# Patient Record
Sex: Male | Born: 1980 | Race: White | Hispanic: No | Smoking: Former smoker
Health system: Southern US, Community
[De-identification: ages and names within clinical notes are randomized; demographics above are authoritative.]

## PROBLEM LIST (undated history)

## (undated) DIAGNOSIS — T8859XA Other complications of anesthesia, initial encounter: Secondary | ICD-10-CM

## (undated) DIAGNOSIS — N189 Chronic kidney disease, unspecified: Secondary | ICD-10-CM

## (undated) DIAGNOSIS — Z87442 Personal history of urinary calculi: Secondary | ICD-10-CM

## (undated) DIAGNOSIS — F419 Anxiety disorder, unspecified: Secondary | ICD-10-CM

## (undated) HISTORY — DX: Chronic kidney disease, unspecified: N18.9

## (undated) HISTORY — PX: MANDIBLE FRACTURE SURGERY: SHX706

---
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
## 2001-01-26 ENCOUNTER — Encounter: Payer: Self-pay | Admitting: Sports Medicine

## 2001-01-26 ENCOUNTER — Encounter: Admission: RE | Admit: 2001-01-26 | Discharge: 2001-01-26 | Payer: Self-pay | Admitting: Sports Medicine

## 2001-01-26 ENCOUNTER — Encounter: Admission: RE | Admit: 2001-01-26 | Discharge: 2001-01-26 | Payer: Self-pay | Admitting: Family Medicine

## 2001-02-03 ENCOUNTER — Encounter: Admission: RE | Admit: 2001-02-03 | Discharge: 2001-02-03 | Payer: Self-pay | Admitting: Sports Medicine

## 2001-02-13 ENCOUNTER — Encounter: Admission: RE | Admit: 2001-02-13 | Discharge: 2001-02-13 | Payer: Self-pay

## 2001-10-24 ENCOUNTER — Emergency Department (HOSPITAL_COMMUNITY): Admission: EM | Admit: 2001-10-24 | Discharge: 2001-10-24 | Payer: Self-pay | Admitting: Emergency Medicine

## 2002-06-28 ENCOUNTER — Emergency Department (HOSPITAL_COMMUNITY): Admission: EM | Admit: 2002-06-28 | Discharge: 2002-06-28 | Payer: Self-pay | Admitting: Emergency Medicine

## 2002-06-28 ENCOUNTER — Encounter: Payer: Self-pay | Admitting: Emergency Medicine

## 2002-07-14 ENCOUNTER — Emergency Department (HOSPITAL_COMMUNITY): Admission: EM | Admit: 2002-07-14 | Discharge: 2002-07-14 | Payer: Self-pay | Admitting: Emergency Medicine

## 2002-11-18 ENCOUNTER — Emergency Department (HOSPITAL_COMMUNITY): Admission: EM | Admit: 2002-11-18 | Discharge: 2002-11-19 | Payer: Self-pay | Admitting: Emergency Medicine

## 2002-11-18 ENCOUNTER — Encounter: Payer: Self-pay | Admitting: Emergency Medicine

## 2003-01-19 ENCOUNTER — Emergency Department (HOSPITAL_COMMUNITY): Admission: EM | Admit: 2003-01-19 | Discharge: 2003-01-20 | Payer: Self-pay | Admitting: Emergency Medicine

## 2003-01-20 ENCOUNTER — Encounter: Payer: Self-pay | Admitting: Emergency Medicine

## 2013-10-03 ENCOUNTER — Emergency Department (HOSPITAL_COMMUNITY)
Admission: EM | Admit: 2013-10-03 | Discharge: 2013-10-03 | Disposition: A | Discharge status: Home / Self Care | Payer: BC Managed Care – PPO | Source: Home / Self Care | Attending: Family Medicine | Admitting: Family Medicine

## 2013-10-03 ENCOUNTER — Encounter (HOSPITAL_COMMUNITY): Payer: Self-pay | Admitting: Emergency Medicine

## 2013-10-03 DIAGNOSIS — K649 Unspecified hemorrhoids: Secondary | ICD-10-CM

## 2013-10-03 NOTE — ED Provider Notes (Signed)
CSN: [id number]     Arrival date & time 10/03/13  1928 History   First MD Initiated Contact with Patient 10/03/13 2050     Chief Complaint  Patient presents with  . Mass   (Consider location/radiation/quality/duration/timing/severity/associated sxs/prior Treatment) HPI  Hemorrhoid - painful, 3 days duration, no bleeding, patient with history of constipation for several years, he is trying stool softener without improvement   Past Medical History  Diagnosis Date  . Kidney stones    History reviewed. No pertinent past surgical history. Family History  Problem Relation Age of Onset  . Hypertension Father   . Asthma Father    History  Substance Use Topics  . Smoking status: Former Smoker    Types: Cigarettes    Quit date: 06/07/2013  . Smokeless tobacco: Current User    Types: Chew  . Alcohol Use: 16.8 oz/week    28 Cans of beer per week    Review of Systems See HPI Allergies  Codeine  Home Medications   Prior to Admission medications   Not on File   BP 137/83  Pulse 80  Temp(Src) 97.9 F (36.6 C) (Oral)  Resp 18  SpO2 98% Physical Exam Gen: middle age WM, non ill appearing Rectum: anterior hemorrhoid, non thrombosed, normal rectal tone ED Course  Procedures (including critical care time) Labs Review Labs Reviewed - No data to display  Imaging Review No results found.   MDM   1. Hemorrhoid    Non thrombosed, Conservative treatment only.     Garnetta BuddyEdward V Ermias Tomeo, MD 10/03/13 2135

## 2013-10-03 NOTE — Discharge Instructions (Signed)
Mr. Jonathan Turner,   You have a hemorrhoid. Please doe the following things:   1. Warm bath for relief several times per day 2. Preparation H or other steroid cream 3. Miralax or dulcolax of other supplement for increase the softness of the stool 4. [facility]  Take Care,   Dr. Clinton SawyerWilliamson

## 2013-10-03 NOTE — ED Notes (Signed)
C/o bump on anus onset 2 days ago.  Getting bigger and harder and more painful.  Could not sleep today or yesterday because of pain. No drainage.  No BM since Mon.  Has been constipated for awhile.  Using suppositories.

## 2013-10-03 NOTE — ED Provider Notes (Signed)
Medical screening examination/treatment/procedure(s) were performed by a resident physician or non-physician practitioner and as the supervising physician I was immediately available for consultation/collaboration.  Evan Corey, MD    Evan S Corey, MD 10/03/13 2142 

## 2013-10-04 ENCOUNTER — Encounter (INDEPENDENT_AMBULATORY_CARE_PROVIDER_SITE_OTHER): Payer: Self-pay | Admitting: Surgery

## 2013-10-04 ENCOUNTER — Ambulatory Visit (INDEPENDENT_AMBULATORY_CARE_PROVIDER_SITE_OTHER): Payer: BC Managed Care – PPO | Admitting: Surgery

## 2013-10-04 ENCOUNTER — Encounter (INDEPENDENT_AMBULATORY_CARE_PROVIDER_SITE_OTHER): Payer: Self-pay

## 2013-10-04 VITALS — BP 122/78 | HR 80 | Temp 98.5°F | Ht 67.0 in | Wt 182.4 lb

## 2013-10-04 DIAGNOSIS — K645 Perianal venous thrombosis: Secondary | ICD-10-CM

## 2013-10-04 MED ORDER — TRAMADOL HCL 50 MG PO TABS: 50.0000 mg | ORAL_TABLET | Freq: Two times a day (BID) | ORAL | Status: DC | PRN

## 2013-10-04 NOTE — Progress Notes (Signed)
Subjective:     Patient ID: Jonathan PearJoshua H Aday, male   DOB: 03/04/1981, 33 y.o.   MRN: [id number]  HPI The patient presents with chief complaint of pain around the anus. This is present since Tuesday. He has significant itching in the anal canal and swelling. It is tender to touch. Patient has problems with constipation he states.  Review of Systems  Respiratory: Negative.   Cardiovascular: Negative.   Gastrointestinal: Positive for constipation and rectal pain.  Musculoskeletal: Negative.   Skin: Negative.    Past Medical History  Diagnosis Date  . Kidney stones   History reviewed. No pertinent past surgical history. No current outpatient prescriptions on file prior to visit.   No current facility-administered medications on file prior to visit.   Allergies  Allergen Reactions  . Codeine Other (See Comments)     Goes in to nuclear meltdown- gets irritable   Family History  Problem Relation Age of Onset  . Hypertension Father   . Asthma Father       Objective:   Physical Exam  Constitutional: He appears well-developed and well-nourished.  Pulmonary/Chest: Effort normal.  Abdominal: Soft.  Genitourinary:     Skin: Skin is warm and dry.  Psychiatric: He has a normal mood and affect. His behavior is normal. Thought content normal.       Assessment:     Thrombosed external hemorrhoid    Plan:     Discussed options of observation versus surgical excision. The pros and cons of each were discussed. Complications of bleeding infection recurrence. He was to proceed with excision. Patient placed in left lateral position. Anal canal was prepped with Betadine 1% lidocaine with epinephrine was used for local anesthesia. This was injected into the thrombosed hemorrhoid. This was left lateral position. Hemorrhoid complex excised. Dry dressing applied.Pt tolerated well.  Post procedure instructions given. Return full activity in 3 days and wear a pad. High fiber diet. miralax daily.    Return as needed.

## 2013-10-04 NOTE — Patient Instructions (Signed)
Warm tub soaks three times a day for 20 min for the next 3 days.  Return to work  Monday. Wear pad as needed. miralax daily as directed.  Fiber supplement daily metamucil,  citrucel etc.                 Hemorrhoidectomy Care After Hemorrhoidectomy is the removal of enlarged (dilated) veins around the rectum. Until the surgical areas are healed, control of pain and avoiding constipation are the greatest challenges for patients.  For as long as 24 hours after receiving an anesthetic (the medication that made you sleep), and while taking narcotic pain relievers, you may feel dizzy, weak and drowsy. For that reason, the following information applies to the first 24-hour period following surgery, and continues for as long as you are taking narcotic pain medications.  Do not drive a car, ride a bicycle, participate in activities in which you could be hurt. Do not take public transportation until you are off narcotic pain medications and until your caregiver says it is okay.  Do not drink alcohol, take tranquilizers, or medications not prescribed or allowed by your surgical caregiver.  Do not sign important papers or contracts for at least 24 hours or while taking narcotic medications.  Have a responsible person with you for 24 hours. RISKS AND COMPLICATIONS Some problems that may occur following this procedure include:  Infection. A germ starts growing in the tissue surrounding the site operated on. This can usually be treated with antibiotics.  Damage to the rectal sphincter could occur. This is the muscle that opens in your anus to allow a bowel movement. This could cause incontinence. This is uncommon.  Bleeding following surgery can be a complication of almost any surgery. Your surgeon takes every precaution to keep this from happening.  Complications of anesthesia. HOME CARE INSTRUCTIONS  Avoid straining when having bowel movements.  Avoid heavy lifting (more than 10 pounds  (4.5 kilograms)).  Only take over-the-counter or prescription medicines for pain, discomfort, or fever as directed by your caregiver.  Take hot sitz baths for 20 to 30 minutes, 3 to 4 times per day.  To keep swelling down, apply an ice pack for twenty minutes three to four times per day between sitz baths. Use a towel between your skin and the ice pack. Do not do this if it causes too much discomfort.  Keep anal area clean and dry. Following a bowel movement, you can gently wash the area with tucks (available for purchase at a drugstore) or cotton swabs. Gently pat the area dry. Do not rub the area.  Eat a well balanced diet and drink 6 to 8 glasses of water every day to avoid constipation. A bulk laxative may be also be helpful. SEEK MEDICAL CARE IF:   You have increasing pain or tenderness near or in the surgical site.  You are unable to eat or drink.  You develop nausea or vomiting.  You develop uncontrolled bleeding such as soaking two to three pads in one hour.  You have constipation, not helped by changing your diet or increasing your fluid intake. Pain medications are a common cause of constipation.  You have pain and redness (inflammation) extending outside the area of your surgery.  You develop an unexplained oral temperature above 102 F (38.9 C), or any other signs of infection.  You have any other questions or concerns following surgery. Document Released: 08/14/2003 Document Revised: 08/16/2011 Document Reviewed: 11/11/2008 [facility] Patient Information 2014 [employer] [place].

## 2013-11-14 ENCOUNTER — Emergency Department (HOSPITAL_COMMUNITY)
Admission: EM | Admit: 2013-11-14 | Discharge: 2013-11-14 | Disposition: A | Discharge status: Home / Self Care | Payer: BC Managed Care – PPO | Attending: Emergency Medicine | Admitting: Emergency Medicine

## 2013-11-14 ENCOUNTER — Emergency Department (HOSPITAL_COMMUNITY): Payer: BC Managed Care – PPO

## 2013-11-14 ENCOUNTER — Encounter (HOSPITAL_COMMUNITY): Payer: Self-pay | Admitting: Emergency Medicine

## 2013-11-14 DIAGNOSIS — R42 Dizziness and giddiness: Secondary | ICD-10-CM | POA: Insufficient documentation

## 2013-11-14 DIAGNOSIS — M62838 Other muscle spasm: Secondary | ICD-10-CM

## 2013-11-14 DIAGNOSIS — F419 Anxiety disorder, unspecified: Secondary | ICD-10-CM

## 2013-11-14 DIAGNOSIS — R0601 Orthopnea: Secondary | ICD-10-CM | POA: Insufficient documentation

## 2013-11-14 DIAGNOSIS — R0602 Shortness of breath: Secondary | ICD-10-CM | POA: Insufficient documentation

## 2013-11-14 DIAGNOSIS — F411 Generalized anxiety disorder: Secondary | ICD-10-CM | POA: Insufficient documentation

## 2013-11-14 DIAGNOSIS — R61 Generalized hyperhidrosis: Secondary | ICD-10-CM | POA: Insufficient documentation

## 2013-11-14 DIAGNOSIS — Z87442 Personal history of urinary calculi: Secondary | ICD-10-CM | POA: Insufficient documentation

## 2013-11-14 DIAGNOSIS — Z87891 Personal history of nicotine dependence: Secondary | ICD-10-CM | POA: Insufficient documentation

## 2013-11-14 DIAGNOSIS — R110 Nausea: Secondary | ICD-10-CM | POA: Insufficient documentation

## 2013-11-14 HISTORY — DX: Anxiety disorder, unspecified: F41.9

## 2013-11-14 LAB — CBC
HCT: 43 % (ref 39.0–52.0)
HEMOGLOBIN: 14.5 g/dL (ref 13.0–17.0)
MCH: 29.2 pg (ref 26.0–34.0)
MCHC: 33.7 g/dL (ref 30.0–36.0)
MCV: 86.5 fL (ref 78.0–100.0)
Platelets: 301 10*3/uL (ref 150–400)
RBC: 4.97 MIL/uL (ref 4.22–5.81)
RDW: 12.8 % (ref 11.5–15.5)
WBC: 6.6 10*3/uL (ref 4.0–10.5)

## 2013-11-14 LAB — BASIC METABOLIC PANEL
BUN: 12 mg/dL (ref 6–23)
CALCIUM: 9.4 mg/dL (ref 8.4–10.5)
CO2: 27 mEq/L (ref 19–32)
Chloride: 101 mEq/L (ref 96–112)
Creatinine, Ser: 0.86 mg/dL (ref 0.50–1.35)
GFR calc Af Amer: >90 mL/min (ref >90)
Glucose, Bld: 85 mg/dL (ref 70–99)
Potassium: 4.1 mEq/L (ref 3.7–5.3)
SODIUM: 140 meq/L (ref 137–147)

## 2013-11-14 LAB — PRO B NATRIURETIC PEPTIDE: Pro B Natriuretic peptide (BNP): <5 pg/mL (ref 0–125)

## 2013-11-14 LAB — I-STAT TROPONIN, ED: TROPONIN I, POC: 0 ng/mL (ref 0.00–0.08)

## 2013-11-14 MED ORDER — DIAZEPAM 5 MG PO TABS
5.0000 mg | ORAL_TABLET | Freq: Once | ORAL | Status: AC
  Administered 2013-11-14: 5 mg via ORAL
  Filled 2013-11-14: qty 1

## 2013-11-14 MED ORDER — KETOROLAC TROMETHAMINE 30 MG/ML IJ SOLN
30.0000 mg | Freq: Once | INTRAMUSCULAR | Status: AC
  Administered 2013-11-14: 30 mg via INTRAMUSCULAR
  Filled 2013-11-14: qty 1

## 2013-11-14 MED ORDER — DIAZEPAM 2 MG PO TABS: 2.0000 mg | ORAL_TABLET | Freq: Three times a day (TID) | ORAL | Status: DC | PRN

## 2013-11-14 MED ORDER — IBUPROFEN 600 MG PO TABS: 600.0000 mg | ORAL_TABLET | Freq: Four times a day (QID) | ORAL | Status: DC | PRN

## 2013-11-14 MED ORDER — KETOROLAC TROMETHAMINE 30 MG/ML IJ SOLN: 30.0000 mg | Freq: Once | INTRAMUSCULAR | Status: DC

## 2013-11-14 NOTE — ED Provider Notes (Addendum)
CSN: [id number]     Arrival date & time 11/14/13  1821 History   First MD Initiated Contact with Patient 11/14/13 2033     Chief Complaint  Patient presents with  . Chest Pain    The patient has been having chest pain today after doing sit ups.  The pain is central chest and is radiating to the right arm with numbness and tingling     (Consider location/radiation/quality/duration/timing/severity/associated sxs/prior Treatment) HPI Comments: This is a 32y/o male presenting today with 5/10 band-like CP and R arm pain that began today while he was doing sit ups. Reports he felt SOB, dizzy, lightheaded, nauseas, and diaphoretic so he stopped doing sit ups. Felt better after laying down for a minute, but then the CP returned so he came. He also reports his R arm is numb/tingling and hurts from his neck all the way down. States he started a job recently that requires heavy lifting, and reports that he's felt more stressed due to his job. Both pains are worse with movement. Denies any illicit drug use or consumption of any energy drinks. Reports drinking daily, and last drink was at 3AM (prior to onset of symptoms). Has anxiety but denies using any medications to treat this. Smoked for 9 yrs, but no longer smokes. Works as a [employer] architecttruck driver. Recently started a new exercise routine.  Patient is a 33 y.o. male presenting with chest pain. The history is provided by the patient and the spouse.  Chest Pain Pain location:  L chest Pain quality: pressure   Pain radiates to:  R jaw, R shoulder and R arm Pain radiates to the back: no   Pain severity:  Moderate (5/10) Onset quality:  Sudden Duration: since 5:30 pm. Timing:  Constant Progression:  Unchanged Chronicity:  New Context: movement (while doing sit ups)   Relieved by:  None tried Worsened by:  Movement Ineffective treatments:  None tried Associated symptoms: anxiety, cough (with white sputum production), diaphoresis, dizziness, nausea,  near-syncope, numbness (right arm), palpitations and shortness of breath   Associated symptoms: no abdominal pain, no back pain, no headache and not vomiting   Cough:    Cough characteristics:  Productive   Sputum characteristics:  White   Severity:  Mild   Onset quality:  Gradual   Timing:  Intermittent   Progression:  Unchanged   Chronicity:  Chronic Nausea:    Severity:  Mild   Onset quality:  Sudden   Progression:  Resolved Shortness of breath:    Severity:  Mild   Onset quality:  Sudden   Timing:  Constant   Progression:  Resolved Risk factors: male sex and smoking (former smoker)     Past Medical History  Diagnosis Date  . Kidney stones   . Anxiety    History reviewed. No pertinent past surgical history. Family History  Problem Relation Age of Onset  . Hypertension Father   . Asthma Father    History  Substance Use Topics  . Smoking status: Former Smoker    Types: Cigarettes    Quit date: 06/07/2013  . Smokeless tobacco: Current User    Types: Chew  . Alcohol Use: 16.8 oz/week    28 Cans of beer per week    Review of Systems  Constitutional: Positive for diaphoresis.  HENT: Negative for congestion, sinus pressure and sore throat.   Eyes: Negative for pain and visual disturbance.  Respiratory: Positive for cough (with white sputum production), chest tightness and shortness of breath.  Negative for wheezing.   Cardiovascular: Positive for chest pain, palpitations and near-syncope.  Gastrointestinal: Positive for nausea. Negative for vomiting, abdominal pain, diarrhea and constipation.  Endocrine: Negative for cold intolerance and heat intolerance.  Genitourinary: Negative for dysuria and difficulty urinating.  Musculoskeletal: Positive for neck pain (right sided, feels tight). Negative for arthralgias and back pain.  Skin: Negative for rash.  Neurological: Positive for dizziness, light-headedness and numbness (right arm). Negative for headaches.   Psychiatric/Behavioral: The patient is nervous/anxious (feels stressed due to new job and recent move).       Allergies  Codeine  Home Medications   Prior to Admission medications   Medication Sig Start Date End Date Taking? Authorizing Provider  diazepam (VALIUM) 2 MG tablet Take 1 tablet (2 mg total) by mouth every 8 (eight) hours as needed for anxiety or muscle spasms. 11/14/13   Chance Munter Strupp Camprubi-Soms, PA-C  ibuprofen (ADVIL,MOTRIN) 600 MG tablet Take 1 tablet (600 mg total) by mouth every 6 (six) hours as needed. 11/14/13   Sunshine Mackowski Strupp Camprubi-Soms, PA-C   BP 106/65  Pulse 52  Temp(Src) 97.8 F (36.6 C) (Oral)  Resp 16  SpO2 96% Physical Exam  Nursing note and vitals reviewed. Constitutional: He is oriented to person, place, and time. He appears well-developed and well-nourished. No distress.  HENT:  Head: Normocephalic and atraumatic.  Eyes: Pupils are equal, round, and reactive to light.  Neck: Normal range of motion. Neck supple.  Reports pain with lateral rotation to the right. Right trapezius tense with spasm  Cardiovascular: Normal rate, regular rhythm, normal heart sounds and intact distal pulses.   Pulmonary/Chest: Effort normal and breath sounds normal. He has no wheezes. He exhibits no tenderness.  Abdominal: Soft. Bowel sounds are normal. He exhibits no distension. There is no tenderness. There is no rebound and no guarding.  Musculoskeletal: Normal range of motion.       Right shoulder: He exhibits tenderness (overlying trapezius, which is notibly tense).  Lymphadenopathy:    He has no cervical adenopathy.  Neurological: He is alert and oriented to person, place, and time. He has normal strength. No sensory deficit.  Skin: Skin is warm and dry.  Psychiatric: His mood appears anxious.    ED Course  Procedures (including critical care time) Labs Review Labs Reviewed  CBC  BASIC METABOLIC PANEL  PRO B NATRIURETIC PEPTIDE  I-STAT TROPOININ, ED     Imaging Review Dg Chest 2 View  11/14/2013   CLINICAL DATA:  Chest pain.  EXAM: CHEST  2 VIEW  COMPARISON:  None.  FINDINGS: The heart size and mediastinal contours are within normal limits. Both lungs are clear. The visualized skeletal structures are unremarkable.  IMPRESSION: No active cardiopulmonary disease.   Electronically Signed   By: Davonna Belling M.D.   On: 11/14/2013 19:41     EKG Interpretation None      MDM   Final diagnoses:  Muscle spasm of right shoulder  Anxiety    Negative cardiac work up including CXR and cardiac enzymes. CBC and CMP within normal limits. I believe this is mostly anxiety related to his recent move and increased stress at work, as well as paracervical muscle spasms related to his recent increased exercise regimen. Pt given Toradol and Valium and will reassess.  At reassessment, pt denies continued CP, states that his R arm still hurts but is feeling better and has less numbness. After long discussion with pt and spouse, discussed his alcohol consumption and reluctantly will  discharge home with Ibuprofen 600mg  q6h and Valium 2mg  q8h PRN anxiety and spasms x5 pills, instructed pt to refrain from alcohol consumption with this medication. Also advised pt not to drive while using this medication. Use heat over affected area as well. Will recommend f/up with PCP, which he will need to be referred to since he is new to the area.     Donnita [place], PA-C 11/14/13 [address] [place], [place] 11/15/13 [phone]

## 2013-11-14 NOTE — Discharge Instructions (Signed)
DO NOT DRIVE WHILE TAKING VALIUM. Muscle Cramps and Spasms Muscle cramps and spasms occur when a muscle or muscles tighten and you have no control over this tightening (involuntary muscle contraction). They are a common problem and can develop in any muscle. The most common place is in the calf muscles of the leg. Both muscle cramps and muscle spasms are involuntary muscle contractions, but they also have differences:   Muscle cramps are sporadic and painful. They may last a few seconds to a quarter of an hour. Muscle cramps are often more forceful and last longer than muscle spasms.  Muscle spasms may or may not be painful. They may also last just a few seconds or much longer. CAUSES  It is uncommon for cramps or spasms to be due to a serious underlying problem. In many cases, the cause of cramps or spasms is unknown. Some common causes are:   Overexertion.   Overuse from repetitive motions (doing the same thing over and over).   Remaining in a certain position for a long period of time.   Improper preparation, form, or technique while performing a sport or activity.   Dehydration.   Injury.   Side effects of some medicines.   Abnormally low levels of the salts and ions in your blood (electrolytes), especially potassium and calcium. This could happen if you are taking water pills (diuretics) or you are pregnant.  Some underlying medical problems can make it more likely to develop cramps or spasms. These include, but are not limited to:   Diabetes.   Parkinson disease.   Hormone disorders, such as thyroid problems.   Alcohol abuse.   Diseases specific to muscles, joints, and bones.   Blood vessel disease where not enough blood is getting to the muscles.  HOME CARE INSTRUCTIONS   Stay well hydrated. Drink enough water and fluids to keep your urine clear or pale yellow.  It may be helpful to massage, stretch, and relax the affected muscle.  For tight or tense  muscles, use a warm towel, heating pad, or hot shower water directed to the affected area.  If you are sore or have pain after a cramp or spasm, applying ice to the affected area may relieve discomfort.  Put ice in a plastic bag.  Place a towel between your skin and the bag.  Leave the ice on for 15-20 minutes, 03-04 times a day.  Medicines used to treat a known cause of cramps or spasms may help reduce their frequency or severity. Only take over-the-counter or prescription medicines as directed by your caregiver. SEEK MEDICAL CARE IF:  Your cramps or spasms get more severe, more frequent, or do not improve over time.  MAKE SURE YOU:   Understand these instructions.  Will watch your condition.  Will get help right away if you are not doing well or get worse. Document Released: 11/13/2001 Document Revised: 09/18/2012 Document Reviewed: 05/10/2012 [facility] Patient Information 2014 [place], [place].  Heat Therapy Heat therapy can help ease achy, tense, stiff, and tight muscles and joints. Heat should not be used on new injuries. Wait at least 48 hours after the injury before using heat therapy. Heat also should not be used for discomfort or pain that occurs right after doing an activity. If you still have pain or stiffness 3 hours after finishing the activity, then heat therapy may be used. PRECAUTIONS  High heat or prolonged exposure to heat can cause burns. Be careful when using heat therapy to avoid burning  your skin. If you have any of the following conditions, do not use heat until you have discussed heat therapy with your caregiver:  Poor circulation.  Healing wounds or scarred skin in the area being treated.  Diabetes, heart disease, or high blood pressure.  Numbness of the area being treated.  Unusual swelling of the area being treated.  Active infections.  Blood clots.  Cancer.  Inability to communicate your response to pain. This can include young children and people  with dementia. HOME CARE INSTRUCTIONS Moist heat pack  Soak a clean towel in warm water, and squeeze out the extra water. The water temperature should be comfortable to the skin.  Put the warm, wet towel in a plastic bag.  Place a thin, dry towel between your skin and the bag.  Put the heat pack on the area for 5 minutes, and check your skin. Your skin may be pink, but it should not be red.  Leave the heat pack on the area for a total of 15 to 30 minutes.  Repeat this every 2 to 4 hours while awake. Do not use heat while you are sleeping. Warm water bath  Fill a tub with warm water. The water temperature should be comfortable to the skin.  Place the affected body part in the tub.  Soak the area for 20 to 40 minutes.  Repeat as needed. Hot water bottle  Fill the water bottle half full with hot water.  Press out the extra air. Close the cap tightly.  Place a dry towel between your skin and the bottle.  Put the bottle on the area for 5 minutes, and check your skin. Your skin may be pink, but it should not be red.  Leave the bottle on the area for a total of 15 to 30 minutes.  Repeat this every 2 to 4 hours while awake. Electric heating pad  Place a dry towel between your skin and the heating pad.  Set the heating pad on low heat.  Put the heating pad on the area for 10 minutes, and check your skin. Your skin may be pink, but it should not be red.  Leave the heating pad on the area for a total of 20 to 40 minutes.  Repeat this every 2 to 4 hours while awake.  Do not lie on the heating pad.  Do not fall asleep while using the heating pad.  Do not use the heating pad near water. Contact with water can result in an electrical shock. SEEK MEDICAL CARE IF:  You have blisters, redness, swelling, or numbness.  You have any new problems.  Your problems are getting worse.  You have any questions or concerns. If you develop any problems, stop using heat therapy until  you see your caregiver. MAKE SURE YOU:  Understand these instructions.  Will watch your condition.  Will get help right away if you are not doing well or get worse. Document Released: 08/16/2011 Document Reviewed: 08/16/2011 [facility] Patient Information 2014 [place], [place].  Emergency Department Resource Guide 1) Find a Doctor and Pay Out of Pocket Although you won't have to find out who is covered by your insurance plan, it is a good idea to ask around and get recommendations. You will then need to call the office and see if the doctor you have chosen will accept you as a new patient and what types of options they offer for patients who are self-pay. Some doctors offer discounts or will set up  payment plans for their patients who do not have insurance, but you will need to ask so you aren't surprised when you get to your appointment.  2) Contact Your Local Health Department Not all health departments have doctors that can see patients for sick visits, but many do, so it is worth a call to see if yours does. If you don't know where your local health department is, you can check in your phone book. The CDC also has a tool to help you locate your state's health department, and many state websites also have listings of all of their local health departments.  3) Find a Walk-in Clinic If your illness is not likely to be very severe or complicated, you may want to try a walk in clinic. These are popping up all over the country in pharmacies, drugstores, and shopping centers. They're usually staffed by nurse practitioners or physician assistants that have been trained to treat common illnesses and complaints. They're usually fairly quick and inexpensive. However, if you have serious medical issues or chronic medical problems, these are probably not your best option.  No Primary Care Doctor: - Call Health Connect at  [phone] - they can help you locate a primary care doctor that  accepts your  insurance, provides certain services, etc. - Physician Referral Service- [phone]  Chronic Pain Problems: Organization         Address  Phone   Notes  [facility]  [phone] Patients need to be referred by their primary care doctor.   Medication Assistance: Organization         Address  Phone   Notes  [facility] [address] [place], [place] [id number] [phone] --Must be a resident of [facility] [facility] [place] -- Must have NO insurance coverage whatsoever (no Medicaid/ Medicare, etc.) -- The pt. MUST have a primary care doctor that directs their care regularly and follows them in the community   [facility]ist  [phone]   [employer]  [phone]) [phone]    Agencies that provide inexpensive medical care: Organization         Address  Phone   Notes  [facility]  [phone]   [facility]    [phone]   [facility] [address] [place], [place] [address] [phone]   [facility] [address], [place] [phone]) [phone]   Planned Parenthood    [phone]   [facility]    [phone]) [phone]   [facility]  [address], [place] Phone:  [phone], Fax:  [phone] Hours of Operation:  9 am - 6 pm, M-F.  Also accepts Medicaid/Medicare and self-pay.  [facility]  [address], [place] Phone: [phone], Fax: [phone]. Hours of Operation:  8:30 am - 5:30 pm, M-F.  Also accepts Medicaid and self-pay.  [facility] [place] [address], [place] Phone: [phone]) [phone]   [facility] [address], [place], [place] [phone], Ext. [phone] Mondays & Thursdays: 7-9 AM.  First 15 patients are seen on a first come, first serve basis.    Medicaid-accepting [facility] Providers:  Organization          Address  Phone   Notes  [facility] [address], [place] [phone] Also accepts  self-pay patients.  [facility] [address] Laurell Josephs [place], [place]  [phone]   [facility] [address], [place] [phone]   [facility] [address], [place] [phone]   Renaye Rakers [address], [place]   [phone] Only accepts [place] Access [place] patients after they have their name applied to their card.   Self-Pay (no insurance) in [facility]:  Organization         Address  Phone   Notes  Sickle Cell Patients, [facility] [address] [place], [place] [phone]   [facility] [address] [place], [place] [phone]   [facility] [place]  [address],  [phone]   [facility]/Dr. Osei-Bonsu  [address], [place] or [address], [place] [phone] Phone number for both [place] and [place] locations is the same.  [facility] [address], [place] [phone]   [facility] [address] [place] or [address] [phone]   [facility] [address] [place] [phone], phone; [phone], fax Sees patients 1st and 3rd Saturday of every month.  Must not qualify for public or private insurance (i.e. Medicaid, Medicare, [place] Health Choice, Veterans' Benefits)  Household income should be no more than 200% of the poverty level The clinic cannot treat you if you are pregnant or think you are pregnant  Sexually transmitted diseases are not treated at the clinic.    Dental Care: Organization         Address  Phone  Notes  [facility] [address] [place],  [place] [phone] Accepts children up to age 81 who are enrolled in [place] or [place] Health Choice; pregnant women with a Medicaid card; and children who have applied for Medicaid or [place] Health Choice, but were declined, whose parents can pay a reduced fee at time of service.  [facility] Department of [facility]  [address], [place] [phone] Accepts children up to age 33 who are enrolled in [place] or [place] Health Choice; pregnant women with a Medicaid card; and children who have applied for Medicaid or [place] Health Choice, but were declined, whose parents can pay a reduced fee at time of service.  [facility]  [address] [place], [place] [phone] Patients are seen by appointment only. Walk-ins are not accepted. [facility] will see patients 37 years of age and older. Monday - Tuesday (8am-5pm) Most Wednesdays (8:30-5pm) $30 per visit, cash only  [facility] Adult Dental Access PROGRAM  [address], [facility] [phone] Patients are seen by appointment only. Walk-ins are not accepted. [facility] will see patients 16 years of age and older. One Wednesday Evening (Monthly: Volunteer Based).  $30 per visit, cash only  [employer]  [phone] for adults; Children under age 33, call Graduate Pediatric Dentistry at [phone]. Children aged 9-14, please call [phone] to request a pediatric application.  Dental services are provided in all areas of dental care including fillings, crowns and bridges, complete and partial dentures, implants, gum treatment, root canals, and extractions. Preventive care is also provided. Treatment is provided to  both adults and children. Patients are selected via a lottery and there is often a waiting list.   [facility] [address] [place]  [phone] [email or web address]   [facility] [address] [place], [place]  [phone], Ext. [phone] Second and Fourth Thursday of each month, opens at 6:30 AM; Clinic ends at 9 AM.  Patients are seen on a first-come first-served basis, and a limited number are seen during each clinic.   [facility]  [address] Ether Griffins [place], [place] [phone]   Eligibility Requirements You must have lived in [place], [place], or  counties for at least the last three months.   You cannot be eligible for state or federal sponsored [place], including [employer], [place], or [employer].   You generally cannot be eligible for healthcare insurance through your employer.    How to apply: Eligibility screenings are held every Tuesday and Wednesday afternoon from 1:00 pm until 4:00 pm. You do not need an appointment for the interview!  [facility] [address] [place], [place] [phone]   [facility] Health Department  [phone]   [facility] Health Department  [phone]   [facility] Health Department  [phone]    Behavioral Health Resources in the Community: Intensive Outpatient Programs Organization         Address  Phone  Notes  [facility] [address] [place], [place] [phone]   [facility] Outpatient [address] [place], [place] [phone]   [facility]: [facility] [address] [place], [place]  [phone]   [facility] Mental Health [address],  [place], [place] [phone] or [phone]   Substance Abuse Resources Organization         Address  Phone  Notes  Al[facility] Drug Services  [phone]   [facility]  [phone]   [facility] [place]  [phone]   Floydene Flock  [phone]   Residential & Outpatient Substance Abuse Program  [phone]   Psychological Services Organization         Address  Phone  Notes  [facility] Behavioral Health  [phone]   [facility] Services  [phone]    [facility] Mental Health [address] [place] [phone] or [phone]    Mobile Crisis Teams Organization         Address  Phone  Notes  [facility], Mobile Crisis Care Unit  [phone]   [facility]  [address] [place], [place] [phone]   Doristine Locks [address] [place] [phone]    Self-Help/Support Groups Organization         Address  Phone             Notes  [facility] [place] - variety of support groups  336- I7437963 Call for more information  Narcotics Anonymous (NA), [facility]aring [facility] [address], [employer] [place]  2 meetings at this location   Statistician         Address  Phone  Notes  [facility] [address],    [place]  [phone]   [facility]  [address] [place] [address], [place], [place] [phone]   [facility] Treatment [facility] [address] [place], [place] [phone] Admissions: 8am-3pm M-F  [facility] [address],    [place], [place] [phone]  [facility] [address]ling Manns [place], [place] [phone]   [facility] [address]  [place], [place] [phone]   [facility] - Intensive Outpatient [address]ure[address], [place], [place] [phone]   [facility] ([facility]iction [facility] [address] [place]  [place], [place] [phone] or [phone]   [facility] (RTS) [address] [place], [place] [phone] Accepts Medicaid  [facility] [address],  [place] [phone] Substance Abuse/Addiction Treatment   [facility] Organization         Address  Phone  Notes  [facility]  [phone]   Angie Fava, PhD [address] Ervin Knack [place], [place]   [phone] or [phone]   [facility] Behavioral   [address] [place] [address] [place], [place] [phone]   [facility] [address], [place], [place] [phone] Insurance/Medicaid/sponsorship through [facility] and Families [address]                                    [place], [place] [phone] Therapy/tele-psych/case  [facility] [address], [place] [phone]    Dr. Lolly Mustache  [phone]   [facility] [place] [facility]  [employer] [facility] Dept. 1) [address] [place] 2) [address], [place] 3)  [address] [place] [address], [place] [phone]  [phone]   [facility] Child Abuse Hotline 719-156-0030 or [phone] (After Hours)

## 2013-11-14 NOTE — ED Notes (Signed)
The patient has been having chest pain today after doing sit ups.  The pain is central chest and is radiating to the right arm with numbness and tingling.  The patient has SOB, diaphoresis, nausea, dizziness, lightheadedness.  The patient says he has been stressing out today and doesn't know why he is having the chest pain and numbness.  He did say he has anxiety but doesn't think that is what it is.  The patient rates his 7/10.

## 2013-11-14 NOTE — ED Provider Notes (Signed)
  Medical screening examination/treatment/procedure(s) were performed by non-physician practitioner and as supervising physician I was available for consultation/collaboration.  EKG shows sinus rhythm, rate 60, left anterior fascicular block, right bundle branch block, abnormal    Gerhard Munch, MD 11/14/13 2358

## 2013-11-16 NOTE — ED Provider Notes (Signed)
Medical screening examination/treatment/procedure(s) were performed by non-physician practitioner and as supervising physician I was immediately available for consultation/collaboration.   EKG Interpretation   Date/Time:  Wednesday November 14 2013 18:28:16 EDT Ventricular Rate:  66 PR Interval:  150 QRS Duration: 116 QT Interval:  380 QTC Calculation: 398 R Axis:   -51 Text Interpretation:  Normal sinus rhythm Incomplete right bundle branch  block Left anterior fascicular block Cannot rule out Anterior infarct ,  age undetermined Abnormal ECG Sinus rhythm Left anterior fasicular block  Right bundle branch block Abnormal ekg Confirmed by Gerhard MunchLOCKWOOD, Haley Fuerstenberg  MD  ([id number]) on 11/14/2013 11:58:10 PM        Gerhard Munchobert Mackenzey [place], MD 11/16/13 2359

## 2015-06-24 ENCOUNTER — Encounter (HOSPITAL_COMMUNITY): Payer: Self-pay | Admitting: Emergency Medicine

## 2015-06-24 ENCOUNTER — Emergency Department (HOSPITAL_COMMUNITY)
Admission: EM | Admit: 2015-06-24 | Discharge: 2015-06-24 | Disposition: A | Discharge status: Home / Self Care | Payer: BLUE CROSS/BLUE SHIELD | Attending: Emergency Medicine | Admitting: Emergency Medicine

## 2015-06-24 ENCOUNTER — Emergency Department (HOSPITAL_COMMUNITY): Payer: BLUE CROSS/BLUE SHIELD

## 2015-06-24 DIAGNOSIS — G8929 Other chronic pain: Secondary | ICD-10-CM | POA: Diagnosis not present

## 2015-06-24 DIAGNOSIS — Z87442 Personal history of urinary calculi: Secondary | ICD-10-CM | POA: Diagnosis not present

## 2015-06-24 DIAGNOSIS — Z87891 Personal history of nicotine dependence: Secondary | ICD-10-CM | POA: Diagnosis not present

## 2015-06-24 DIAGNOSIS — M542 Cervicalgia: Secondary | ICD-10-CM

## 2015-06-24 DIAGNOSIS — R079 Chest pain, unspecified: Secondary | ICD-10-CM | POA: Diagnosis present

## 2015-06-24 DIAGNOSIS — F419 Anxiety disorder, unspecified: Secondary | ICD-10-CM | POA: Diagnosis not present

## 2015-06-24 LAB — CBC
HCT: 44.8 % (ref 39.0–52.0)
Hemoglobin: 15.3 g/dL (ref 13.0–17.0)
MCH: 29.5 pg (ref 26.0–34.0)
MCHC: 34.2 g/dL (ref 30.0–36.0)
MCV: 86.3 fL (ref 78.0–100.0)
PLATELETS: 297 10*3/uL (ref 150–400)
RBC: 5.19 MIL/uL (ref 4.22–5.81)
RDW: 12.9 % (ref 11.5–15.5)
WBC: 7.5 10*3/uL (ref 4.0–10.5)

## 2015-06-24 LAB — BASIC METABOLIC PANEL
Anion gap: 10 (ref 5–15)
BUN: 10 mg/dL (ref 6–20)
CALCIUM: 9.6 mg/dL (ref 8.9–10.3)
CO2: 25 mmol/L (ref 22–32)
CREATININE: 0.9 mg/dL (ref 0.61–1.24)
Chloride: 105 mmol/L (ref 101–111)
GFR calc non Af Amer: >60 mL/min (ref >60)
GLUCOSE: 99 mg/dL (ref 65–99)
Potassium: 4 mmol/L (ref 3.5–5.1)
Sodium: 140 mmol/L (ref 135–145)

## 2015-06-24 LAB — I-STAT TROPONIN, ED: TROPONIN I, POC: 0 ng/mL (ref 0.00–0.08)

## 2015-06-24 MED ORDER — PREDNISONE 20 MG PO TABS
60.0000 mg | ORAL_TABLET | Freq: Once | ORAL | Status: AC
  Administered 2015-06-24: 60 mg via ORAL
  Filled 2015-06-24: qty 3

## 2015-06-24 MED ORDER — PREDNISONE 20 MG PO TABS: 40.0000 mg | ORAL_TABLET | Freq: Every day | ORAL | Status: DC

## 2015-06-24 MED ORDER — CYCLOBENZAPRINE HCL 10 MG PO TABS: 10.0000 mg | ORAL_TABLET | Freq: Two times a day (BID) | ORAL | Status: DC | PRN

## 2015-06-24 MED ORDER — NAPROXEN 500 MG PO TABS
500.0000 mg | ORAL_TABLET | Freq: Two times a day (BID) | ORAL | Status: DC
Start: 2015-06-24 — End: 2016-02-26

## 2015-06-24 MED ORDER — KETOROLAC TROMETHAMINE 60 MG/2ML IM SOLN
60.0000 mg | Freq: Once | INTRAMUSCULAR | Status: AC
  Administered 2015-06-24: 60 mg via INTRAMUSCULAR
  Filled 2015-06-24: qty 2

## 2015-06-24 NOTE — ED Provider Notes (Signed)
CSN: [id number]     Arrival date & time 06/24/15  1408 History   First MD Initiated Contact with Patient 06/24/15 1923     Chief Complaint  Patient presents with  . Chest Pain     (Consider location/radiation/quality/duration/timing/severity/associated sxs/prior Treatment) HPI Jonathan Turner is a 35 y.o. male with hx of kidney stones and anxiety, presents to ED with complaint of right neck pain. Pt states he has had neck pain for 6 months. States he has been seen by orthopedic specialist and even did a course of physical therapy. He states that it did not help, and his pain got worse yesterday. He states today he had an episode of severe sudden onset of sharp pain in the same area which he states "makes me dizzy, short of breath, pale, and I felt numb all over." Patient states that since being in the waiting room, he has felt better. He still continues to have the same chronic pain in his neck. When he checked into the ER he told triage nurse that he was feeling short of breath, and he had some blood work and chest x-ray ordered for shortness of breath workup. At this time patient denies numbness or weakness in his extremities. He denies any shortness of breath. He denies any recent injuries. 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 He denies any recent injuries.  Past Medical History  Diagnosis Date  . Kidney stones   . Anxiety    History reviewed. No pertinent past surgical history. Family History  Problem Relation Age of Onset  . Hypertension Father   . Asthma Father    Social History  Substance Use Topics  . Smoking status: Former Smoker    Types: Cigarettes    Quit date: 06/07/2013  . Smokeless tobacco: Current User    Types: Chew  . Alcohol Use: No    Review of Systems  Constitutional: Negative for fever and chills.  Respiratory: Negative for cough, chest tightness and shortness of breath.   Cardiovascular: Negative for chest pain,  palpitations and leg swelling.  Gastrointestinal: Negative for nausea, vomiting, abdominal pain, diarrhea and abdominal distention.  Genitourinary: Negative for urgency.  Musculoskeletal: Positive for neck pain and neck stiffness. Negative for myalgias, back pain and arthralgias.  Skin: Negative for rash.  Allergic/Immunologic: Negative for immunocompromised state.  Neurological: Negative for dizziness, weakness, light-headedness, numbness and headaches.  All other systems reviewed and are negative.     Allergies  Codeine  Home Medications   Prior to Admission medications   Medication Sig Start Date End Date Taking? Authorizing Provider  diazepam (VALIUM) 2 MG tablet Take 1 tablet (2 mg total) by mouth every 8 (eight) hours as needed for anxiety or muscle spasms. 11/14/13   Mercedes Camprubi-Soms, PA-C  ibuprofen (ADVIL,MOTRIN) 600 MG tablet Take 1 tablet (600 mg total) by mouth every 6 (six) hours as needed. 11/14/13   Mercedes Camprubi-Soms, PA-C   BP 132/85 mmHg  Pulse 69  Temp(Src) 98 F (36.7 C) (Oral)  Resp 18  SpO2 100% Physical Exam  Constitutional: He is oriented to person, place, and time. He appears well-developed and well-nourished. No distress.  HENT:  Head: Normocephalic and atraumatic.  Eyes: Conjunctivae are normal.  Neck: Normal range of motion. Neck supple.  Full range of motion of the neck. Tenderness to palpation over right trapezius muscle and sternocleidomastoid. Pain with range of motion of the neck.  Cardiovascular: Normal rate, regular rhythm and normal  heart sounds.   Pulmonary/Chest: Effort normal. No respiratory distress. He has no wheezes. He has no rales.  Abdominal: Soft. Bowel sounds are normal. He exhibits no distension. There is no tenderness. There is no rebound.  Musculoskeletal: He exhibits no edema.  Neurological: He is alert and oriented to person, place, and time.  Reflex Scores:      Brachioradialis reflexes are 2+ on the right side and  2+ on the left side. 5/5 and equal upper and lower extremity strength bilaterally. Equal grip strength bilaterally. Normal sensation in bilateral upper arms and forearms. Normal sensation bilateral hands. Refill less than 2 seconds distally. Distal radial pulses intact and equal bilaterally.  Skin: Skin is warm and dry.  Nursing note and vitals reviewed.   ED Course  Procedures (including critical care time) Labs Review Labs Reviewed  BASIC METABOLIC PANEL  CBC  I-STAT TROPOININ, ED    Imaging Review Dg Chest 2 View  06/24/2015  CLINICAL DATA:  Midsternal chest pain with shortness of breath for 4 days EXAM: CHEST  2 VIEW COMPARISON:  November 14, 2013 FINDINGS: Lungs are clear. Heart size and pulmonary vascularity are normal. No adenopathy. There is slight upper thoracic levoscoliosis. No pneumothorax. IMPRESSION: No edema or consolidation. Electronically Signed   By: Bretta Bang III M.D.   On: 06/24/2015 18:10   I have personally reviewed and evaluated these images and lab results as part of my medical decision-making.   EKG Interpretation None      MDM   Final diagnoses:  Neck pain    patient with right-sided neck pain for 6 months, worsened yesterday and today. His symptoms are now almost completely resolved other than chronic pain in the neck. He states episode started suddenly today, associated with shortness of breath, dizziness, sensation of numbness all over and fingers tingling. Question muscle spasm with vagal response versus anxiety. He is neurovascularly intact. His strength and reflexes intact bilateral arms. I do not think he needs an emergent MRI, although patient is wondering if he has some nerve damage which is responsible for his hand tingling. To him he will need to follow-up with his orthopedics doctor, he requested another referral. Will give referral to orthopedist on call. He stated that he will just wanted second opinion. Will prescribe penicillin, Flexeril,  advised to follow-up as needed.  Filed Vitals:   06/24/15 1417 06/24/15 1627  BP: 142/87 132/85  Pulse: 77 69  Temp: 97.7 F (36.5 C) 98 F (36.7 C)  TempSrc: Oral Oral  Resp: 18 18  SpO2: 100% 100%     Jaynie Crumble, PA-C 06/24/15 2017  Leta Baptist, MD 07/02/15 2150

## 2015-06-24 NOTE — Discharge Instructions (Signed)
Naproxen as prescribed for inflammation. Prednisone for inflammation as well. Take until all gone. Take Flexeril at night time or as needed. Try heating pads, stretches, follow-up with orthopedic specialist.   Muscle Cramps and Spasms Muscle cramps and spasms occur when a muscle or muscles tighten and you have no control over this tightening (involuntary muscle contraction). They are a common problem and can develop in any muscle. The most common place is in the calf muscles of the leg. Both muscle cramps and muscle spasms are involuntary muscle contractions, but they also have differences:   Muscle cramps are sporadic and painful. They may last a few seconds to a quarter of an hour. Muscle cramps are often more forceful and last longer than muscle spasms.  Muscle spasms may or may not be painful. They may also last just a few seconds or much longer. CAUSES  It is uncommon for cramps or spasms to be due to a serious underlying problem. In many cases, the cause of cramps or spasms is unknown. Some common causes are:   Overexertion.   Overuse from repetitive motions (doing the same thing over and over).   Remaining in a certain position for a long period of time.   Improper preparation, form, or technique while performing a sport or activity.   Dehydration.   Injury.   Side effects of some medicines.   Abnormally low levels of the salts and ions in your blood (electrolytes), especially potassium and calcium. This could happen if you are taking water pills (diuretics) or you are pregnant.  Some underlying medical problems can make it more likely to develop cramps or spasms. These include, but are not limited to:   Diabetes.   Parkinson disease.   Hormone disorders, such as thyroid problems.   Alcohol abuse.   Diseases specific to muscles, joints, and bones.   Blood vessel disease where not enough blood is getting to the muscles.  HOME CARE INSTRUCTIONS   Stay well  hydrated. Drink enough water and fluids to keep your urine clear or pale yellow.  It may be helpful to massage, stretch, and relax the affected muscle.  For tight or tense muscles, use a warm towel, heating pad, or hot shower water directed to the affected area.  If you are sore or have pain after a cramp or spasm, applying ice to the affected area may relieve discomfort.  Put ice in a plastic bag.  Place a towel between your skin and the bag.  Leave the ice on for 15-20 minutes, 03-04 times a day.  Medicines used to treat a known cause of cramps or spasms may help reduce their frequency or severity. Only take over-the-counter or prescription medicines as directed by your caregiver. SEEK MEDICAL CARE IF:  Your cramps or spasms get more severe, more frequent, or do not improve over time.  MAKE SURE YOU:   Understand these instructions.  Will watch your condition.  Will get help right away if you are not doing well or get worse.   This information is not intended to replace advice given to you by your health care provider. Make sure you discuss any questions you have with your health care provider.   Document Released: 11/13/2001 Document Revised: 09/18/2012 Document Reviewed: 05/10/2012 Elsevier Interactive Patient Education 2016 [employer].   Acute Torticollis Torticollis is a condition in which the muscles of the neck tighten (contract) abnormally, causing the neck to twist and the head to move into an unnatural position. Torticollis  that develops suddenly is called acute torticollis. If torticollis becomes chronic and is left untreated, the face and neck can become deformed. CAUSES This condition may be caused by:  Sleeping in an awkward position (common).  Extending or twisting the neck muscles beyond their normal position.  Infection. In some cases, the cause may not be known. SYMPTOMS Symptoms of this condition include:  An unnatural position of the head.  Neck  pain.  A limited ability to move the neck.  Twisting of the neck to one side. DIAGNOSIS This condition is diagnosed with a physical exam. You may also have imaging tests, such as an X-ray, CT scan, or MRI. TREATMENT Treatment for this condition involves trying to relax the neck muscles. It may include:  Medicines or shots.  Physical therapy.  Surgery. This may be done in severe cases. HOME CARE INSTRUCTIONS  Take medicines only as directed by your health care provider.  Do stretching exercises and massage your neck as directed by your health care provider.  Keep all follow-up visits as directed by your health care provider. This is important. SEEK MEDICAL CARE IF:  You develop a fever. SEEK IMMEDIATE MEDICAL CARE IF:  You develop difficulty breathing.  You develop noisy breathing (stridor).  You start drooling.  You have trouble swallowing or have pain with swallowing.  You develop numbness or weakness in your hands or feet.  You have changes in your speech, understanding, or vision.  Your pain gets worse.   This information is not intended to replace advice given to you by your health care provider. Make sure you discuss any questions you have with your health care provider.   Document Released: 05/21/2000 Document Revised: 10/08/2014 Document Reviewed: 05/20/2014 Elsevier Interactive Patient Education [employer]

## 2015-06-24 NOTE — ED Notes (Signed)
Pt sts mid sternal CP that is tightness with numbness all over; pt sts hx of anxiety and having sx since Friday

## 2015-06-24 NOTE — ED Notes (Signed)
Pt left with all his belongings and ambulated out of the treatment area.  

## 2015-07-03 ENCOUNTER — Telehealth (HOSPITAL_BASED_OUTPATIENT_CLINIC_OR_DEPARTMENT_OTHER): Payer: Self-pay | Admitting: Emergency Medicine

## 2016-02-13 ENCOUNTER — Encounter: Payer: Self-pay | Admitting: Vascular Surgery

## 2016-02-19 ENCOUNTER — Encounter: Payer: Self-pay | Admitting: Vascular Surgery

## 2016-02-19 ENCOUNTER — Ambulatory Visit (INDEPENDENT_AMBULATORY_CARE_PROVIDER_SITE_OTHER): Payer: Worker's Compensation | Admitting: Vascular Surgery

## 2016-02-19 VITALS — BP 125/81 | HR 62 | Temp 97.0°F | Resp 16 | Ht 67.0 in | Wt 185.0 lb

## 2016-02-19 DIAGNOSIS — M542 Cervicalgia: Secondary | ICD-10-CM

## 2016-02-19 NOTE — Progress Notes (Signed)
Referring Physician: Tressie StalkerJeffrey Jenkins, MD  Patient name: Jonathan PearJoshua H Turner MRN: [id number] DOB: 12/25/1980 Sex: male  REASON FOR CONSULT: Possible thoracic outlet syndrome HPI: Jonathan PearJoshua H Turner is a 35 y.o. male,  with about a 16 month history of right neck pain with numbness and tingling in the tips of digits 3 and 4 right side. The patient denies any specific traumatic event to the right arm. However he states that his symptoms initially presented after doing a significant amount of heavy lifting over his head at work. He states that the symptoms have not really improved any over the last year. He did get some improvement after a course of physical therapy when he was evaluated by Dr. Shelle IronBeane with orthopedics. However, he stated that his physical therapy appointments ran out and he has not had any further evaluation or treatment since then. I discussed with him whether or not they gave him any certain exercises to do regarding his physical therapy evaluation and he stated that he has not currently done any exercise is related to this. He denies any swelling in the arm. He apparently has been to the emergency room several times complaining of feeling like his heart stopped followed by a racing of his heart. He was diagnosed with anxiety. He is currently not taking any medications. The pain is fairly constant and dull but when he has his "episodes" the neck pain gets worse. He denies any swelling in the upper extremity. His sister and several [employer]men[employer]. representatives were present for the office visit today. He denies any other medical problems.  Past Medical History:  Diagnosis Date  . Anxiety   . Kidney stones    No past surgical history on file.  Family History  Problem Relation Age of Onset  . Hypertension Father   . Asthma Father     SOCIAL HISTORY: Social History   Social History  . Marital status: Married    Spouse name: N/A  . Number of children: N/A  . Years of education: N/A     Occupational History  . Not on file.   Social History Main Topics  . Smoking status: Former Smoker    Types: Cigarettes    Quit date: 06/07/2013  . Smokeless tobacco: Current User    Types: Chew  . Alcohol use No  . Drug use: No  . Sexual activity: Not on file   Other Topics Concern  . Not on file   Social History Narrative  . No narrative on file    Allergies  Allergen Reactions  . Codeine Other (See Comments)     Goes in to nuclear meltdown- gets irritable    Current Outpatient Prescriptions  Medication Sig Dispense Refill  . ibuprofen (ADVIL,MOTRIN) 600 MG tablet Take 1 tablet (600 mg total) by mouth every 6 (six) hours as needed. 30 tablet 0  . cyclobenzaprine (FLEXERIL) 10 MG tablet Take 1 tablet (10 mg total) by mouth 2 (two) times daily as needed for muscle spasms. (Patient not taking: Reported on 02/19/2016) 20 tablet 0  . diazepam (VALIUM) 2 MG tablet Take 1 tablet (2 mg total) by mouth every 8 (eight) hours as needed for anxiety or muscle spasms. (Patient not taking: Reported on 02/19/2016) 5 tablet 0  . gabapentin (NEURONTIN) 100 MG capsule Take 100 mg by mouth 3 (three) times daily.    . naproxen (NAPROSYN) 500 MG tablet Take 1 tablet (500 mg total) by mouth 2 (two) times daily. (Patient not taking:  Reported on 02/19/2016) 30 tablet 0  . predniSONE (DELTASONE) 20 MG tablet Take 2 tablets (40 mg total) by mouth daily. (Patient not taking: Reported on 02/19/2016) 10 tablet 0   No current facility-administered medications for this visit.     ROS:   General:  No weight loss, Fever, chills  HEENT: No recent headaches, no nasal bleeding, no visual changes, no sore throat  Neurologic: No dizziness, blackouts, seizures. No recent symptoms of stroke or mini- stroke. No recent episodes of slurred speech, or temporary blindness.  Cardiac: No recent episodes of chest pain/pressure, no shortness of breath at rest.  No shortness of breath with exertion.  Denies history of  atrial fibrillation or irregular heartbeat  Vascular: No history of rest pain in feet.  No history of claudication.  No history of non-healing ulcer, No history of DVT   Pulmonary: No home oxygen, no productive cough, no hemoptysis,  No asthma or wheezing  Musculoskeletal:  [ ]  Arthritis, [ ]  Low back pain,  [ ]  Joint pain  Hematologic:No history of hypercoagulable state.  No history of easy bleeding.  No history of anemia  Gastrointestinal: No hematochezia or melena,  No gastroesophageal reflux, no trouble swallowing  Urinary: [ ]  chronic Kidney disease, [ ]  on HD - [ ]  MWF or [ ]  TTHS, [ ]  Burning with urination, [ ]  Frequent urination, [ ]  Difficulty urinating;   Skin: No rashes  Psychological: No history of anxiety,  No history of depression   Physical Examination  Vitals:   02/19/16 1400  BP: 125/81  Pulse: 62  Resp: 16  Temp: 97 F (36.1 C)  TempSrc: Oral  SpO2: 99%  Weight: 185 lb (83.9 kg)  Height: 5\' 7"  (1.702 m)    Body mass index is 28.98 kg/m.  General:  Alert and oriented, no acute distress HEENT: Normal Neck: No bruit or JVD, tender to palpation base of right neck especially in the supraclavicular region right side Pulmonary: Clear to auscultation bilaterally Cardiac: Regular Rate and Rhythm without murmur Abdomen: Soft, non-tender, non-distended, no mass, no scars Skin: No rash, symmetric right and left upper extremities no significant edema Extremity Pulses:  2+ radial, brachial pulses bilaterally Musculoskeletal: No deformity or edema, patient has some element of pain in the right base of the right neck and supraclavicular region when flexing the right sternocleidomastoid muscle. He has mild pain but not similar when doing this on the contralateral side.  Neurologic: Upper and lower extremity motor 5/5 and symmetric  DATA:  The patient recently had an MRI scan performed of his neck and shoulder region. I reviewed all of these images with the patient  and his family today in the room. 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 On my review of the images I did not see any significant narrowing of any vascular structures specifically in the right thoracic outlet region. I also reviewed a previous chest x-ray that had been on outpatient which showed no cervical ribs.  Reviewed over 20 pages of medical records from Dr. Lovell Sheehan and Dr. Shelle Iron today. This suggested he did have some mild cervical disc disease but it was not thought to be the source of all of his symptoms. It was mentioned that an EMG had been done by Dr. Vela Prose previously but those details were not available and records not available today for review. Patient stated that he thought as far as the EMG was normal.  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 I did not really see significant compression on his MRI scan. I discussed with the patient today and additional course of physical therapy since apparently he did seem to get some benefit from this. This would be a course of physical therapy specifically related to thoracic outlet syndrome. He seemed interested in this. However, his sister also was adamant that the patient receive a ""diagnosis".  I did reassure the patient that I did not think he had any life threatening problems related to the symptoms in his right hand and neck. I agree with his previous emergency room assessment from January suggesting that this may be a vagal response as far as his heart rate symptoms are concerned.   PLAN:  I offered the patient a follow-up visit after his physical therapy to see if his symptoms have improved. However his sister was adamant that he receive a diagnosis today. I discussed with them that he could potentially have thoracic outlet syndrome but this is a difficult  diagnosis to make when only neurogenic symptoms are present. I have referred him to Dr. Algis Downs' amico at [facility] for an additional opinion regarding possible neurogenic thoracic outlet syndrome. They will follow up with me on an as-needed basis.   Fabienne Bruns, MD [facility] Office: [phone] Pager: [phone]

## 2016-02-26 ENCOUNTER — Emergency Department (HOSPITAL_COMMUNITY)
Admission: EM | Admit: 2016-02-26 | Discharge: 2016-02-26 | Disposition: A | Discharge status: Home / Self Care | Payer: Worker's Compensation | Attending: Emergency Medicine | Admitting: Emergency Medicine

## 2016-02-26 ENCOUNTER — Emergency Department (HOSPITAL_COMMUNITY): Payer: Worker's Compensation

## 2016-02-26 ENCOUNTER — Encounter (HOSPITAL_COMMUNITY): Payer: Self-pay | Admitting: Emergency Medicine

## 2016-02-26 DIAGNOSIS — X509XXA Other and unspecified overexertion or strenuous movements or postures, initial encounter: Secondary | ICD-10-CM | POA: Insufficient documentation

## 2016-02-26 DIAGNOSIS — Z87891 Personal history of nicotine dependence: Secondary | ICD-10-CM | POA: Diagnosis not present

## 2016-02-26 DIAGNOSIS — Y9389 Activity, other specified: Secondary | ICD-10-CM | POA: Diagnosis not present

## 2016-02-26 DIAGNOSIS — R42 Dizziness and giddiness: Secondary | ICD-10-CM

## 2016-02-26 DIAGNOSIS — M5410 Radiculopathy, site unspecified: Secondary | ICD-10-CM | POA: Insufficient documentation

## 2016-02-26 DIAGNOSIS — Y990 Civilian activity done for income or pay: Secondary | ICD-10-CM | POA: Diagnosis not present

## 2016-02-26 DIAGNOSIS — Y9289 Other specified places as the place of occurrence of the external cause: Secondary | ICD-10-CM | POA: Insufficient documentation

## 2016-02-26 DIAGNOSIS — M792 Neuralgia and neuritis, unspecified: Secondary | ICD-10-CM

## 2016-02-26 DIAGNOSIS — M542 Cervicalgia: Secondary | ICD-10-CM | POA: Diagnosis present

## 2016-02-26 MED ORDER — KETOROLAC TROMETHAMINE 30 MG/ML IJ SOLN
30.0000 mg | Freq: Once | INTRAMUSCULAR | Status: AC
  Administered 2016-02-26: 30 mg via INTRAVENOUS
  Filled 2016-02-26: qty 1

## 2016-02-26 MED ORDER — HYDROMORPHONE HCL 1 MG/ML IJ SOLN
0.5000 mg | Freq: Once | INTRAMUSCULAR | Status: AC
  Administered 2016-02-26: 0.5 mg via INTRAVENOUS
  Filled 2016-02-26: qty 1

## 2016-02-26 MED ORDER — SODIUM CHLORIDE 0.9 % IV BOLUS (SEPSIS)
1000.0000 mL | Freq: Once | INTRAVENOUS | Status: AC
  Administered 2016-02-26: 1000 mL via INTRAVENOUS

## 2016-02-26 MED ORDER — MECLIZINE HCL 25 MG PO TABS
50.0000 mg | ORAL_TABLET | Freq: Once | ORAL | Status: AC
  Administered 2016-02-26: 50 mg via ORAL
  Filled 2016-02-26: qty 2

## 2016-02-26 MED ORDER — MECLIZINE HCL 25 MG PO TABS: 25.0000 mg | ORAL_TABLET | Freq: Three times a day (TID) | ORAL | 0 refills | Status: DC | PRN

## 2016-02-26 NOTE — ED Notes (Signed)
Pt provided with a boxed lunch and water at this time per pt request.  Pt continues to await MRI at this time.

## 2016-02-26 NOTE — ED Provider Notes (Signed)
[facility]-EMERGENCY DEPT Provider Note   CSN: [id number] Arrival date & time: 02/26/16  1452     History   Chief Complaint Chief Complaint  Patient presents with  . Neck Injury    HPI Jonathan Turner is a 35 y.o. male who presents with neck pain. He states he has been having symptoms constantly since he heard something "pop" when lifting at his job in April of 2017. He reports pain is worse with movement of his neck and right shoulder and arm. The pain radiates down his right arm and feels like a sharp stabbing pain. He has associated dysarthria, eye pain with movement, photophobia, numbness, and the feeling that his heart stops. He states today he has had lightheadedness and dizziness with positional changes. He was outside earlier today for about 30 minutes however states he has had adequate fluid intake. He states he believes he is dying. Denies LOC, fever, HA, CP, SOB, abdominal pain, N/V, recent injury. He saw Dr. Darrick Penna with Vascular surgery on 9/14 who states he may possibly have neurogenic thoracic outlet syndrome and was subsequently referred to Dr. Algis DownsGeorg Ruddle at [facility] for further evaluation. He has also seen Dr. Shelle Iron with Ortho who had him complete physical therapy which provided some relief. Unfortunately this was cut short which he is upset about. 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 Never had MRI of brain. He had an EMG done by Dr. Vela Prose with Neuro which was normal. He is scheduled for another round of physical therapy as well.  HPI  Past Medical History:  Diagnosis Date  . Anxiety   . Kidney stones     Patient Active Problem List   Diagnosis Date Noted  . Thrombosed external hemorrhoid 10/04/2013    History reviewed. No pertinent surgical history.    Home Medications    Prior to Admission medications   Medication Sig Start Date End Date Taking? Authorizing Provider  cyclobenzaprine  (FLEXERIL) 10 MG tablet Take 1 tablet (10 mg total) by mouth 2 (two) times daily as needed for muscle spasms. Patient not taking: Reported on 02/19/2016 06/24/15   Tatyana Kirichenko, PA-C  diazepam (VALIUM) 2 MG tablet Take 1 tablet (2 mg total) by mouth every 8 (eight) hours as needed for anxiety or muscle spasms. Patient not taking: Reported on 02/19/2016 11/14/13   Mercedes Camprubi-Soms, PA-C  gabapentin (NEURONTIN) 100 MG capsule Take 100 mg by mouth 3 (three) times daily.    Historical Provider, MD  ibuprofen (ADVIL,MOTRIN) 600 MG tablet Take 1 tablet (600 mg total) by mouth every 6 (six) hours as needed. 11/14/13   Mercedes Camprubi-Soms, PA-C  naproxen (NAPROSYN) 500 MG tablet Take 1 tablet (500 mg total) by mouth 2 (two) times daily. Patient not taking: Reported on 02/19/2016 06/24/15   Tatyana Kirichenko, PA-C  predniSONE (DELTASONE) 20 MG tablet Take 2 tablets (40 mg total) by mouth daily. Patient not taking: Reported on 02/19/2016 06/24/15   Jaynie Crumble, PA-C    Family History Family History  Problem Relation Age of Onset  . Hypertension Father   . Asthma Father     Social History Social History  Substance Use Topics  . Smoking status: Former Smoker    Types: Cigarettes    Quit date: 06/07/2013  . Smokeless tobacco: Current User    Types: Chew  . Alcohol use No     Allergies   Codeine   Review of  Systems Review of Systems  Constitutional: Negative for chills and fever.  Respiratory: Negative for shortness of breath.   Cardiovascular: Negative for chest pain.  Gastrointestinal: Negative for abdominal pain.  Musculoskeletal: Positive for arthralgias, neck pain and neck stiffness.  Neurological: Positive for dizziness, light-headedness and numbness. Negative for syncope and headaches.     Physical Exam Updated Vital Signs BP 126/77 (BP Location: Right Arm)   Pulse 66   Temp 98 F (36.7 C) (Oral)   Resp 24   SpO2 100%   Physical Exam  Constitutional: He is  oriented to person, place, and time. He appears well-developed and well-nourished.  HENT:  Head: Normocephalic and atraumatic.  Eyes: Conjunctivae are normal. Pupils are equal, round, and reactive to light. Right eye exhibits no discharge. Left eye exhibits no discharge. No scleral icterus.  Neck: Normal range of motion. Neck supple.  No midline tenderness. Tenderness of the right SCM and right trapezius. Pain with ROM of neck - specifically when with looking to the right and neck extension.   Cardiovascular: Normal rate and regular rhythm.  Exam reveals no gallop and no friction rub.   No murmur heard. Pulmonary/Chest: Effort normal and breath sounds normal. No respiratory distress. He has no wheezes. He has no rales. He exhibits no tenderness.  Abdominal: He exhibits no distension.  Musculoskeletal: He exhibits no edema.  Mental Status:  Alert, oriented. Difficult exam due to his frustration. Speech fluent without evidence of aphasia. Able to follow 2 step commands without difficulty.  Cranial Nerves:  II:  Peripheral visual fields grossly normal, pupils equal, round, reactive to light. Reports photophobia. III,IV, VI: ptosis not present, extra-ocular motions intact bilaterally  V,VII: smile symmetric, facial light touch sensation equal VIII: hearing grossly normal to voice  X: uvula elevates symmetrically  XI: bilateral shoulder shrug symmetric and strong XII: midline tongue extension without fassiculations Motor:  Normal tone. 5/5 in upper and lower extremities bilaterally including strong and equal grip strength and dorsiflexion/plantar flexion Sensory: Pinprick and light touch normal in all extremities.  Cerebellar: normal finger-to-nose with bilateral upper extremities Gait: Sits up in bed however refuses to stand due to dizziness CV: distal pulses palpable throughout    Neurological: He is alert and oriented to person, place, and time.  Skin: Skin is warm and dry.  Psychiatric:  His speech is normal. His mood appears anxious. His affect is angry. He is agitated.  Nursing note and vitals reviewed.    ED Treatments / Results  Labs (all labs ordered are listed, but only abnormal results are displayed) Labs Reviewed - No data to display  EKG  EKG Interpretation None       Radiology No results found.  Procedures Procedures (including critical care time)  Medications Ordered in ED Medications - No data to display   Initial Impression / Assessment and Plan / ED Course  I have reviewed the triage vital signs and the nursing notes.  Pertinent labs & imaging results that were available during my care of the patient were reviewed by me and considered in my medical decision making (see chart for details).  Clinical Course   3:15PM Patient is very agitated and frustrated. Vitals signs reviewed which are WNL and stable. Orthostatics are negative. IVF started. Pain medicine ordered. EKG shows SR with RBBB which is unchanged since last.  4:30PM Spoke with wife on the phone and updated her on plan.  5:00PM Patient is more calm after pain medicine and fluids. Pain is better  but is still severely dizzy with standing. Meclizine ordered. Spoke with Dr. Cherylynn Ridges MRI brain ordered  10:30PM Patient states Meclezine has helped with dizziness. He did not like Dilaudid. MRI of brain is negative. Will d/c with Meclizine rx and have him follow up with Dr. Ewing Schlein at [facility] and have him try PT again since it helped. Patient is NAD, non-toxic, with stable VS. Patient is informed of clinical course, understands medical decision making process, and agrees with plan. Opportunity for questions provided and all questions answered. Return precautions given.   Final Clinical Impressions(s) / ED Diagnoses   Final diagnoses:  Dizziness  Neck pain on right side  Radicular pain in right arm    New Prescriptions Discharge Medication List as of 02/26/2016 10:59 PM    START taking  these medications   Details  meclizine (ANTIVERT) 25 MG tablet Take 1 tablet (25 mg total) by mouth 3 (three) times daily as needed for dizziness., Starting Thu 02/26/2016, Print         Bethel Born, PA-C 02/27/16 0102    Jerelyn Scott, MD 03/02/16 [phone]

## 2016-02-26 NOTE — ED Notes (Signed)
While medicating pt he became very angry with his situation and  "getting no answers for his neck pain he has had for over one year." pt began cursing and rasing his voice. Pt asked to please not speak that way.

## 2016-02-26 NOTE — ED Notes (Signed)
Pt being transported to MRI.

## 2016-02-26 NOTE — Discharge Instructions (Signed)
[facility]  Please try to get back in to physical therapy as soon as possible

## 2016-02-26 NOTE — ED Triage Notes (Signed)
Pt arrives per ems for recurrence of his chronic neck pain that is worse today with dizziness and lightheadedness when standing.

## 2016-02-26 NOTE — ED Notes (Signed)
Pt provided with d/c instructions at this time. Pt verbalizes understanding of d/c instructions as well as follow up procedure after d/c. Pt in no apparent distress at this time. Pt assisted out of the ED via WC at this time.  Pt in no apparent distress at this time.

## 2016-03-02 ENCOUNTER — Encounter: Payer: Self-pay | Admitting: Specialist

## 2016-06-17 ENCOUNTER — Emergency Department (HOSPITAL_COMMUNITY): Payer: Self-pay

## 2016-06-17 ENCOUNTER — Encounter (HOSPITAL_COMMUNITY): Payer: Self-pay

## 2016-06-17 ENCOUNTER — Emergency Department (HOSPITAL_COMMUNITY)
Admission: EM | Admit: 2016-06-17 | Discharge: 2016-06-17 | Disposition: A | Discharge status: Home / Self Care | Payer: Self-pay | Attending: Emergency Medicine | Admitting: Emergency Medicine

## 2016-06-17 DIAGNOSIS — Z87891 Personal history of nicotine dependence: Secondary | ICD-10-CM | POA: Insufficient documentation

## 2016-06-17 DIAGNOSIS — N202 Calculus of kidney with calculus of ureter: Secondary | ICD-10-CM | POA: Insufficient documentation

## 2016-06-17 DIAGNOSIS — Z79899 Other long term (current) drug therapy: Secondary | ICD-10-CM | POA: Insufficient documentation

## 2016-06-17 DIAGNOSIS — N200 Calculus of kidney: Secondary | ICD-10-CM

## 2016-06-17 LAB — COMPREHENSIVE METABOLIC PANEL
ALT: 35 U/L (ref 17–63)
ANION GAP: 7 (ref 5–15)
AST: 23 U/L (ref 15–41)
Albumin: 4.7 g/dL (ref 3.5–5.0)
Alkaline Phosphatase: 62 U/L (ref 38–126)
BUN: 16 mg/dL (ref 6–20)
CO2: 28 mmol/L (ref 22–32)
CREATININE: 0.97 mg/dL (ref 0.61–1.24)
Calcium: 9.3 mg/dL (ref 8.9–10.3)
Chloride: 103 mmol/L (ref 101–111)
Glucose, Bld: 163 mg/dL — ABNORMAL HIGH (ref 65–99)
Potassium: 3.8 mmol/L (ref 3.5–5.1)
Sodium: 138 mmol/L (ref 135–145)
Total Bilirubin: 0.6 mg/dL (ref 0.3–1.2)
Total Protein: 7.7 g/dL (ref 6.5–8.1)

## 2016-06-17 LAB — URINALYSIS, ROUTINE W REFLEX MICROSCOPIC
BILIRUBIN URINE: NEGATIVE
GLUCOSE, UA: NEGATIVE mg/dL
HGB URINE DIPSTICK: NEGATIVE
KETONES UR: NEGATIVE mg/dL
Leukocytes, UA: NEGATIVE
Nitrite: NEGATIVE
PROTEIN: NEGATIVE mg/dL
Specific Gravity, Urine: 1.02 (ref 1.005–1.030)
pH: 8 (ref 5.0–8.0)

## 2016-06-17 LAB — CBC
HCT: 42.9 % (ref 39.0–52.0)
HEMOGLOBIN: 14.7 g/dL (ref 13.0–17.0)
MCH: 28.7 pg (ref 26.0–34.0)
MCHC: 34.3 g/dL (ref 30.0–36.0)
MCV: 83.6 fL (ref 78.0–100.0)
PLATELETS: 314 10*3/uL (ref 150–400)
RBC: 5.13 MIL/uL (ref 4.22–5.81)
RDW: 12.9 % (ref 11.5–15.5)
WBC: 7.7 10*3/uL (ref 4.0–10.5)

## 2016-06-17 MED ORDER — ONDANSETRON 4 MG PO TBDP
4.0000 mg | ORAL_TABLET | Freq: Once | ORAL | Status: AC
  Administered 2016-06-17: 4 mg via ORAL
  Filled 2016-06-17: qty 1

## 2016-06-17 MED ORDER — SODIUM CHLORIDE 0.9 % IV SOLN
Freq: Once | INTRAVENOUS | Status: AC
  Administered 2016-06-17: 12:00:00 via INTRAVENOUS

## 2016-06-17 MED ORDER — TAMSULOSIN HCL 0.4 MG PO CAPS: 0.4000 mg | ORAL_CAPSULE | Freq: Every day | ORAL | 0 refills | Status: DC

## 2016-06-17 MED ORDER — ONDANSETRON 4 MG PO TBDP: 4.0000 mg | ORAL_TABLET | Freq: Three times a day (TID) | ORAL | 0 refills | Status: DC | PRN

## 2016-06-17 MED ORDER — HYDROMORPHONE HCL 1 MG/ML IJ SOLN
1.0000 mg | Freq: Once | INTRAMUSCULAR | Status: AC
  Administered 2016-06-17: 1 mg via INTRAVENOUS
  Filled 2016-06-17: qty 1

## 2016-06-17 MED ORDER — OXYCODONE-ACETAMINOPHEN 5-325 MG PO TABS: 2.0000 | ORAL_TABLET | ORAL | 0 refills | Status: DC | PRN

## 2016-06-17 MED ORDER — KETOROLAC TROMETHAMINE 30 MG/ML IJ SOLN
30.0000 mg | Freq: Once | INTRAMUSCULAR | Status: AC
  Administered 2016-06-17: 30 mg via INTRAVENOUS
  Filled 2016-06-17: qty 1

## 2016-06-17 MED ORDER — ONDANSETRON 4 MG PO TBDP
4.0000 mg | ORAL_TABLET | Freq: Once | ORAL | Status: DC | PRN
  Filled 2016-06-17: qty 1

## 2016-06-17 NOTE — ED Notes (Signed)
Bed: WTR7 Expected date:  Expected time:  Means of arrival:  Comments: Hold for triage 3

## 2016-06-17 NOTE — ED Provider Notes (Signed)
[facility]-EMERGENCY DEPT Provider Note   CSN: [id number] Arrival date & time: 06/17/16  1114  By signing my name below, I, Vista Mink, attest that this documentation has been prepared under the direction and in the presence of Langston Masker PA-C.  Electronically Signed: Vista Mink, ED Scribe. 06/17/16. 12:38 PM.   History   Chief Complaint Chief Complaint  Patient presents with  . Urinary Retention  . Flank Pain    HPI HPI Comments: Jonathan Turner is a 36 y.o. male, with Hx of kidney stones, who presents to the Emergency Department complaining of gradual onset left flank pain that started approximately one hour prior to arrival. Pt also expresses concerns for urinary retention and states that he did not urinate last night and only had a "trickle" of urine this morning that was dark yellow. Pt states that his previous kidney stones presented with right sided flank pain and has not had left sided flank pain before. No hematuria. No fever.  The history is provided by the patient. No language interpreter was used.    Past Medical History:  Diagnosis Date  . Anxiety   . Kidney stones     Patient Active Problem List   Diagnosis Date Noted  . Thrombosed external hemorrhoid 10/04/2013    History reviewed. No pertinent surgical history.   Home Medications    Prior to Admission medications   Medication Sig Start Date End Date Taking? Authorizing Provider  meclizine (ANTIVERT) 25 MG tablet Take 1 tablet (25 mg total) by mouth 3 (three) times daily as needed for dizziness. 02/26/16   Bethel Born, PA-C    Family History Family History  Problem Relation Age of Onset  . Hypertension Father   . Asthma Father     Social History Social History  Substance Use Topics  . Smoking status: Former Smoker    Types: Cigarettes    Quit date: 06/07/2013  . Smokeless tobacco: Former Neurosurgeon    Types: Chew  . Alcohol use No    Allergies   Codeine   Review of Systems Review of Systems   Constitutional: Negative for fever.  Genitourinary: Positive for decreased urine volume, difficulty urinating and flank pain (left). Negative for hematuria.     Physical Exam Updated Vital Signs BP 118/99 (BP Location: Left Arm)   Pulse 83   Temp 97.7 F (36.5 C) (Oral)   Ht 5\' 7"  (1.702 m)   Wt 195 lb (88.5 kg)   SpO2 100%   BMI 30.54 kg/m   Physical Exam  Constitutional: He is oriented to person, place, and time. He appears well-developed and well-nourished. No distress.  HENT:  Head: Normocephalic and atraumatic.  Neck: Normal range of motion.  Pulmonary/Chest: Effort normal.  Abdominal: There is tenderness.  Tender left abdomen  Musculoskeletal: He exhibits tenderness.  Left flank tenderness  Neurological: He is alert and oriented to person, place, and time.  Skin: Skin is warm and dry. He is not diaphoretic.  Psychiatric: He has a normal mood and affect. Judgment normal.  Nursing note and vitals reviewed.   ED Treatments / Results  DIAGNOSTIC STUDIES: Oxygen Saturation is 100% on RA, normal by my interpretation.  COORDINATION OF CARE: 12:30 PM-Discussed treatment plan with pt at bedside and pt agreed to plan.   Labs (all labs ordered are listed, but only abnormal results are displayed) Labs Reviewed  URINALYSIS, ROUTINE W REFLEX MICROSCOPIC - Abnormal; Notable for the following:       Result Value  APPearance CLOUDY (*)    All other components within normal limits  CBC  COMPREHENSIVE METABOLIC PANEL    EKG  EKG Interpretation None      Radiology No results found.  Procedures Procedures (including critical care time)  Medications Ordered in ED Medications  ondansetron (ZOFRAN-ODT) disintegrating tablet 4 mg (4 mg Oral Given 06/17/16 1147)     Initial Impression / Assessment and Plan / ED Course  I have reviewed the triage vital signs and the nursing notes.  Pertinent labs & imaging results that were available during my care of the patient  were reviewed by me and considered in my medical decision making (see chart for details).  Clinical Course    Pt reports pain relief with dilaudid  And torodol.  Pt counseled on kidney stones   Final Clinical Impressions(s) / ED Diagnoses   Final diagnoses:  Kidney stone    New Prescriptions Discharge Medication List as of 06/17/2016  2:37 PM    START taking these medications   Details  ondansetron (ZOFRAN ODT) 4 MG disintegrating tablet Take 1 tablet (4 mg total) by mouth every 8 (eight) hours as needed for nausea or vomiting., Starting Thu 06/17/2016, Print    oxyCODONE-acetaminophen (PERCOCET/ROXICET) 5-325 MG tablet Take 2 tablets by mouth every 4 (four) hours as needed for severe pain., Starting Thu 06/17/2016, Print    tamsulosin (FLOMAX) 0.4 MG CAPS capsule Take 1 capsule (0.4 mg total) by mouth daily., Starting Thu 06/17/2016, Print       Current Meds  Medication Sig  . acetaminophen (TYLENOL) 500 MG tablet Take 500-1,000 mg by mouth every 6 (six) hours as needed for moderate pain or headache.   An After Visit Summary was printed and given to the patient.  I personally performed the services in this documentation, which was scribed in my presence.  The recorded information has been reviewed and considered.   Barnet PallKaren SofiaPAC.   Lonia SkinnerLeslie K Clay CitySofia, PA-C 06/17/16 1839    Loren Raceravid Yelverton, MD 06/26/16 1137

## 2016-06-17 NOTE — ED Notes (Signed)
Dr. Erma HeritageIsaacs notified that the patient was in Triage 3 and reported that he had not voided all night and when he did it was a trickle. Also reported that the bladder scan was <30.

## 2016-06-17 NOTE — ED Triage Notes (Signed)
Patient c/o left flank pain that started approx 1 hour ago. Patient stated that he did not urinate during the night. Patient also reports that he had trickling of urine this AM that was dark yellow. No visible blood.

## 2016-06-17 NOTE — ED Notes (Signed)
Pt continues to ask for more pain medication.  Will notify EDP.

## 2016-06-17 NOTE — ED Notes (Signed)
Pt and Pt's wife escalating and yelling at staff.  Staff has provided comfort measures and this RN provided Zofran.  Pt came storming out of the room toward this restroom.  When this RN asked him to provide a sample, Pt sts "I need pain medicine.  I'm dying."  Pt reassured that he is in stable condition.

## 2017-01-18 ENCOUNTER — Encounter (HOSPITAL_COMMUNITY): Payer: Self-pay | Admitting: Emergency Medicine

## 2017-01-18 ENCOUNTER — Encounter: Payer: Self-pay | Admitting: Neurology

## 2017-01-18 ENCOUNTER — Emergency Department (HOSPITAL_COMMUNITY)
Admission: EM | Admit: 2017-01-18 | Discharge: 2017-01-18 | Disposition: A | Discharge status: Home / Self Care | Payer: Self-pay | Attending: Emergency Medicine | Admitting: Emergency Medicine

## 2017-01-18 ENCOUNTER — Telehealth: Payer: Self-pay | Admitting: Emergency Medicine

## 2017-01-18 DIAGNOSIS — R51 Headache: Secondary | ICD-10-CM | POA: Insufficient documentation

## 2017-01-18 DIAGNOSIS — R030 Elevated blood-pressure reading, without diagnosis of hypertension: Secondary | ICD-10-CM

## 2017-01-18 DIAGNOSIS — Z87891 Personal history of nicotine dependence: Secondary | ICD-10-CM | POA: Insufficient documentation

## 2017-01-18 DIAGNOSIS — F419 Anxiety disorder, unspecified: Secondary | ICD-10-CM | POA: Insufficient documentation

## 2017-01-18 DIAGNOSIS — I10 Essential (primary) hypertension: Secondary | ICD-10-CM | POA: Insufficient documentation

## 2017-01-18 DIAGNOSIS — R519 Headache, unspecified: Secondary | ICD-10-CM

## 2017-01-18 LAB — COMPREHENSIVE METABOLIC PANEL
ALBUMIN: 4.2 g/dL (ref 3.5–5.0)
ALT: 97 U/L — ABNORMAL HIGH (ref 17–63)
ANION GAP: 9 (ref 5–15)
AST: 39 U/L (ref 15–41)
Alkaline Phosphatase: 61 U/L (ref 38–126)
BUN: 13 mg/dL (ref 6–20)
CHLORIDE: 104 mmol/L (ref 101–111)
CO2: 25 mmol/L (ref 22–32)
Calcium: 9.2 mg/dL (ref 8.9–10.3)
Creatinine, Ser: 1.03 mg/dL (ref 0.61–1.24)
GFR calc Af Amer: >60 mL/min (ref >60)
GFR calc non Af Amer: >60 mL/min (ref >60)
GLUCOSE: 145 mg/dL — AB (ref 65–99)
POTASSIUM: 3.6 mmol/L (ref 3.5–5.1)
SODIUM: 138 mmol/L (ref 135–145)
Total Bilirubin: 0.8 mg/dL (ref 0.3–1.2)
Total Protein: 7.2 g/dL (ref 6.5–8.1)

## 2017-01-18 LAB — CBC WITH DIFFERENTIAL/PLATELET
BASOS ABS: 0.1 10*3/uL (ref 0.0–0.1)
BASOS PCT: 1 %
EOS ABS: 0.2 10*3/uL (ref 0.0–0.7)
Eosinophils Relative: 2 %
HCT: 42.4 % (ref 39.0–52.0)
Hemoglobin: 14.8 g/dL (ref 13.0–17.0)
Lymphocytes Relative: 35 %
Lymphs Abs: 3.2 10*3/uL (ref 0.7–4.0)
MCH: 28.7 pg (ref 26.0–34.0)
MCHC: 34.9 g/dL (ref 30.0–36.0)
MCV: 82.3 fL (ref 78.0–100.0)
MONO ABS: 0.6 10*3/uL (ref 0.1–1.0)
MONOS PCT: 6 %
NEUTROS PCT: 56 %
Neutro Abs: 5.2 10*3/uL (ref 1.7–7.7)
Platelets: 302 10*3/uL (ref 150–400)
RBC: 5.15 MIL/uL (ref 4.22–5.81)
RDW: 13.1 % (ref 11.5–15.5)
WBC: 9.2 10*3/uL (ref 4.0–10.5)

## 2017-01-18 MED ORDER — METOCLOPRAMIDE HCL 5 MG/ML IJ SOLN
10.0000 mg | Freq: Once | INTRAMUSCULAR | Status: AC
  Administered 2017-01-18: 10 mg via INTRAVENOUS
  Filled 2017-01-18: qty 2

## 2017-01-18 MED ORDER — KETOROLAC TROMETHAMINE 30 MG/ML IJ SOLN
30.0000 mg | Freq: Once | INTRAMUSCULAR | Status: AC
Start: 2017-01-18 — End: 2017-01-18
  Administered 2017-01-18: 30 mg via INTRAVENOUS
  Filled 2017-01-18: qty 1

## 2017-01-18 MED ORDER — DIPHENHYDRAMINE HCL 50 MG/ML IJ SOLN
25.0000 mg | Freq: Once | INTRAMUSCULAR | Status: AC
  Administered 2017-01-18: 25 mg via INTRAVENOUS
  Filled 2017-01-18: qty 1

## 2017-01-18 NOTE — Telephone Encounter (Signed)
CM consulted for PCP needs.  Called and spoke with pt who states he does not have ins at this time but started a new job.  Pt additionally stated [facility] was [facility] and will be establishing a PCP at [facility] next week.  Pt states he also has a neurology appointment scheduled in October.  No further CM needs noted at this time.

## 2017-01-18 NOTE — ED Provider Notes (Signed)
[facility]-EMERGENCY DEPT Provider Note   CSN: [id number] Arrival date & time: 01/18/17  0032     History   Chief Complaint Chief Complaint  Patient presents with  . Hypertension    HPI Jonathan Turner is a 36 y.o. male.  Patient presents to the ED with a chief complaint of headache and hypertension.  He states that over the past year he has had "strange neurological" symptoms including intermittent vertigo, tingling in his face, and pain in his trapezius.  He states that he has seen a neurologist and a vascular surgeon after having an accident last year, but has not followed up since.  There was some concern about neurogenic thoracic outlet syndrome, but patient was never able to follow-up with the specialist he was referred to at [facility].  He states that his symptoms have continued to come and go for the past year.  He states that he was seen in [place] 5 days ago and had a negative head CT.  He was prescribed Vistaril for anxiety which helped some.   The history is provided by the patient. No language interpreter was used.    Past Medical History:  Diagnosis Date  . Anxiety   . Kidney stones     Patient Active Problem List   Diagnosis Date Noted  . Thrombosed external hemorrhoid 10/04/2013    History reviewed. No pertinent surgical history.     Home Medications    Prior to Admission medications   Medication Sig Start Date End Date Taking? Authorizing Provider  hydrOXYzine (VISTARIL) 50 MG capsule Take 50 mg by mouth 3 (three) times daily as needed for anxiety.  01/13/17  Yes [provider]  meclizine (ANTIVERT) 25 MG tablet Take 1 tablet (25 mg total) by mouth 3 (three) times daily as needed for dizziness. Patient not taking: Reported on 06/17/2016 02/26/16   Bethel Born, PA-C  ondansetron (ZOFRAN ODT) 4 MG disintegrating tablet Take 1 tablet (4 mg total) by mouth every 8 (eight) hours as needed for nausea or vomiting. Patient not taking: Reported on 01/18/2017  06/17/16   Elson Areas, PA-C  oxyCODONE-acetaminophen (PERCOCET/ROXICET) 5-325 MG tablet Take 2 tablets by mouth every 4 (four) hours as needed for severe pain. Patient not taking: Reported on 01/18/2017 06/17/16   Elson Areas, PA-C  tamsulosin (FLOMAX) 0.4 MG CAPS capsule Take 1 capsule (0.4 mg total) by mouth daily. Patient not taking: Reported on 01/18/2017 06/17/16   Osie Cheeks    Family History Family History  Problem Relation Age of Onset  . Hypertension Father   . Asthma Father     Social History Social History  Substance Use Topics  . Smoking status: Former Smoker    Types: Cigarettes    Quit date: 06/07/2013  . Smokeless tobacco: Former Neurosurgeon    Types: Chew  . Alcohol use No     Allergies   Codeine   Review of Systems Review of Systems  All other systems reviewed and are negative.    Physical Exam Updated Vital Signs BP 138/89 (BP Location: Right Arm)   Pulse 64   Temp 98.2 F (36.8 C) (Oral)   Resp 18   Ht 5\' 8"  (1.727 m)   Wt 89.4 kg (197 lb)   SpO2 99%   BMI 29.95 kg/m   Physical Exam  Constitutional: He is oriented to person, place, and time. He appears well-developed and well-nourished.  HENT:  Head: Normocephalic and atraumatic.  Right Ear: External  ear normal.  Left Ear: External ear normal.  Eyes: Pupils are equal, round, and reactive to light. Conjunctivae and EOM are normal. Right eye exhibits no discharge. Left eye exhibits no discharge. No scleral icterus.  Neck: Normal range of motion. Neck supple. No JVD present.  No pain with neck flexion, no meningismus  Cardiovascular: Normal rate, regular rhythm and normal heart sounds.  Exam reveals no gallop and no friction rub.   No murmur heard. Pulmonary/Chest: Effort normal and breath sounds normal. No respiratory distress. He has no wheezes. He has no rales. He exhibits no tenderness.  Abdominal: Soft. He exhibits no distension and no mass. There is no tenderness. There is no  rebound and no guarding.  Musculoskeletal: Normal range of motion. He exhibits no edema or tenderness.  Normal gait.  Neurological: He is alert and oriented to person, place, and time. He has normal reflexes.  CN 3-12 intact, normal finger to nose, no pronator drift, sensation and strength intact bilaterally.  Skin: Skin is warm and dry.  Psychiatric: He has a normal mood and affect. His behavior is normal. Judgment and thought content normal.  Nursing note and vitals reviewed.    ED Treatments / Results  Labs (all labs ordered are listed, but only abnormal results are displayed) Labs Reviewed  COMPREHENSIVE METABOLIC PANEL - Abnormal; Notable for the following:       Result Value   Glucose, Bld 145 (*)    ALT 97 (*)    All other components within normal limits  CBC WITH DIFFERENTIAL/PLATELET    EKG  EKG Interpretation  Date/Time:  Tuesday January 18 2017 00:36:25 EDT Ventricular Rate:  77 PR Interval:  152 QRS Duration: 120 QT Interval:  396 QTC Calculation: 448 R Axis:   -46 Text Interpretation:  Normal sinus rhythm Right bundle branch block Left anterior fascicular block * Bifascicular block * Possible Anterolateral infarct , age undetermined Abnormal ECG No significant change since last tracing Confirmed by Zadie RhineWickline, Donald ([id number]) on 01/18/2017 4:19:49 AM       Radiology No results found.  Procedures Procedures (including critical care time)  Medications Ordered in ED Medications  ketorolac (TORADOL) 30 MG/ML injection 30 mg (not administered)  metoCLOPramide (REGLAN) injection 10 mg (not administered)  diphenhydrAMINE (BENADRYL) injection 25 mg (not administered)     Initial Impression / Assessment and Plan / ED Course  I have reviewed the triage vital signs and the nursing notes.  Pertinent labs & imaging results that were available during my care of the patient were reviewed by me and considered in my medical decision making (see chart for details).      Patient with headache, hypertension and subjective intermittent numbness and vision changes for the past year.  Lost follow-up. Some concern over thoracic outlet syndrome.  MS also on differential, but neurovascularly intact now.  Discussed with Dr. Bebe ShaggyWickline, who recommends outpatient neurology follow-up. ]  5:45 AM Feels much better after meds.  BP normalizing.  F/u with neurology.  Final Clinical Impressions(s) / ED Diagnoses   Final diagnoses:  Intractable headache, unspecified chronicity pattern, unspecified headache type  Elevated blood pressure reading    New Prescriptions New Prescriptions   No medications on file     Roxy HorsemanBrowning, Lynzee Lindquist, Cordelia Poche-C 01/18/17 0546    Zadie RhineWickline, Donald, MD 01/18/17 0730

## 2017-01-18 NOTE — Discharge Instructions (Signed)
You need to follow-up with a neurologist.  You may need additional advanced imaging such as an MRI to assess for diseases such as MS.  You also need to follow-up with a primary care doctor for blood pressure control.  Please return to the ER for worsening symptoms.

## 2017-01-18 NOTE — ED Notes (Signed)
Presents to ed c/o headache and high blood pressure. States he had been having issues with high blood pressure for over a year. Has been seen by several different MD/s, states he doesn't have a PCP. States he hasn't had time to follow up with anyone.

## 2017-01-18 NOTE — ED Triage Notes (Signed)
Pt to ED with c/o headache and hypertension.   Pt st's he does not have hx of HTN.  Pt also c/o right side of face feeling numb x's 4 days.  Pt was seen at [facility] on 8/9 and given Rx for Visteril.  Pt st's he does not feel any better

## 2017-03-22 ENCOUNTER — Ambulatory Visit: Payer: Self-pay | Admitting: Neurology

## 2017-06-10 ENCOUNTER — Encounter: Payer: Self-pay | Admitting: Neurology

## 2017-06-10 ENCOUNTER — Ambulatory Visit (INDEPENDENT_AMBULATORY_CARE_PROVIDER_SITE_OTHER): Payer: BLUE CROSS/BLUE SHIELD | Admitting: Neurology

## 2017-06-10 VITALS — BP 122/90 | HR 71 | Ht 68.0 in | Wt 192.0 lb

## 2017-06-10 DIAGNOSIS — R519 Headache, unspecified: Secondary | ICD-10-CM

## 2017-06-10 DIAGNOSIS — R42 Dizziness and giddiness: Secondary | ICD-10-CM | POA: Diagnosis not present

## 2017-06-10 DIAGNOSIS — R51 Headache: Secondary | ICD-10-CM

## 2017-06-10 DIAGNOSIS — F419 Anxiety disorder, unspecified: Secondary | ICD-10-CM

## 2017-06-10 MED ORDER — VENLAFAXINE HCL ER 37.5 MG PO CP24: ORAL_CAPSULE | ORAL | 6 refills | Status: DC

## 2017-06-10 NOTE — Progress Notes (Signed)
NEUROLOGY CONSULTATION NOTE  Jonathan Turner MRN: [id number] DOB: 12/12/1980  Referring provider: Dr. Zadie Rhine (ER) Primary care provider: [facility]  Reason for consult:  Headache, dizzy, tingling in face  Dear Dr Bebe Shaggy:  Thank you for your kind referral of Jonathan Turner for consultation of the above symptoms. Although his history is well known to you, please allow me to reiterate it for the purpose of our medical record. Records and images were personally reviewed where available.   HISTORY OF PRESENT ILLNESS: This is a pleasant 37 year old left-handed man with a history of anxiety, presenting for evaluation of headache, dizziness, and facial tingling. He feels that anxiety plays a role, he is under a lot of stress going through a divorce, but states he had symptoms even before the divorce. He was having right-sided headaches with sharp pains that stopped after he had tooth repair done 2 months ago. He however continues to report episodes where he has a dizzy spell described as a sensation of movement. There is no clear positional component. He would have numbness on the right temple beside his eye, sometimes tingling, followed by a mild headache. Previously the headaches would be so overwhelming, but since the tooth repair, this is better. He feels tired after. There is no associated nausea/vomiting, photo/phonophobia. He sometimes sees little black squiggly lines. He was started on Effexor due to the anxiety he is having with the divorce, he states this has calmed down a lot but "the divorce is killing me." These episodes occur multiple times a day, up to 6 times a day, lasting 10-15 minutes at a time. No clear triggers. He has them even if he had good sleep. He is working on alcohol intake, he drinks 2 beers a day now, but he stopped alcohol for a year but still had these episodes. 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 He feels that as long as he is moving, he is fine, but once he stops, it feels like the world is moving. He is also reporting "dazed episodes" where he gets stuck in "dazes," there is a black cloud on his peripheral vision, this occurs around 4-5 times a day for several years. He feels tired after. He denies any olfactory/gustatory hallucinations, rising epigastric sensation, myoclonic jerks. He reports a nervous tic where he feels a nervous twitch in his nose or involuntary need to shake his head, but this has not happened recently.  His paternal grandfather and mother have migraines. He denies any prior history of migraines. He has terrible anxiety. There were times he would fall over in school like if someone was laughing at him, he fell flat on his face and somewhat passed out in 2001. He has floaters in his vision all the time. He has constant numbness in the 3rd and 4th digits of his right hand since a neck injury. He has prn Ativan 1/2 tablet which he takes every 2 weeks, but it makes him feel like he is in another world. He has seen several physicians in the past for numbness and dizziness, and had an MRI brain without contrast in 02/2016 which I personally reviewed, no acute changes.    PAST MEDICAL HISTORY: Past Medical History:  Diagnosis Date  . Anxiety   . Kidney stones     PAST SURGICAL HISTORY: History reviewed. No pertinent surgical history.  MEDICATIONS: Current Outpatient Medications  on File Prior to Visit  Medication Sig Dispense Refill  . hydrOXYzine (VISTARIL) 50 MG capsule Take 50 mg by mouth 3 (three) times daily as needed for anxiety.   0  . meclizine (ANTIVERT) 25 MG tablet Take 1 tablet (25 mg total) by mouth 3 (three) times daily as needed for dizziness. (Patient not taking: Reported on 06/17/2016) 30 tablet 0  . ondansetron (ZOFRAN ODT) 4 MG disintegrating tablet Take 1 tablet (4 mg total) by mouth every 8 (eight) hours as needed for nausea or vomiting.  (Patient not taking: Reported on 01/18/2017) 20 tablet 0  . oxyCODONE-acetaminophen (PERCOCET/ROXICET) 5-325 MG tablet Take 2 tablets by mouth every 4 (four) hours as needed for severe pain. (Patient not taking: Reported on 01/18/2017) 15 tablet 0  . tamsulosin (FLOMAX) 0.4 MG CAPS capsule Take 1 capsule (0.4 mg total) by mouth daily. (Patient not taking: Reported on 01/18/2017) 15 capsule 0   No current facility-administered medications on file prior to visit.     ALLERGIES: Allergies  Allergen Reactions  . Codeine Other (See Comments)     Goes in to nuclear meltdown- gets irritable    FAMILY HISTORY: Family History  Problem Relation Age of Onset  . Hypertension Father   . Asthma Father     SOCIAL HISTORY: Social History   Socioeconomic History  . Marital status: Legally Separated    Spouse name: Not on file  . Number of children: Not on file  . Years of education: Not on file  . Highest education level: Not on file  Social Needs  . Financial resource strain: Not on file  . Food insecurity - worry: Not on file  . Food insecurity - inability: Not on file  . Transportation needs - medical: Not on file  . Transportation needs - non-medical: Not on file  Occupational History  . Not on file  Tobacco Use  . Smoking status: Former Smoker    Types: Cigarettes    Last attempt to quit: 06/07/2013    Years since quitting: 4.0  . Smokeless tobacco: Former Neurosurgeon    Types: Chew  Substance and Sexual Activity  . Alcohol use: No    Alcohol/week: 16.8 oz    Types: 28 Cans of beer per week  . Drug use: No  . Sexual activity: Not on file  Other Topics Concern  . Not on file  Social History Narrative  . Not on file    REVIEW OF SYSTEMS: Constitutional: No fevers, chills, or sweats, no generalized fatigue, change in appetite Eyes: No visual changes, double vision, eye pain Ear, nose and throat: No hearing loss, ear pain, nasal congestion, sore throat Cardiovascular: No chest pain,  palpitations Respiratory:  No shortness of breath at rest or with exertion, wheezes GastrointestinaI: No nausea, vomiting, diarrhea, abdominal pain, fecal incontinence Genitourinary:  No dysuria, urinary retention or frequency Musculoskeletal:  No neck pain, back pain Integumentary: No rash, pruritus, skin lesions Neurological: as above Psychiatric: No depression, insomnia, +anxiety Endocrine: No palpitations, fatigue, diaphoresis, mood swings, change in appetite, change in weight, increased thirst Hematologic/Lymphatic:  No anemia, purpura, petechiae. Allergic/Immunologic: no itchy/runny eyes, nasal congestion, recent allergic reactions, rashes  PHYSICAL EXAM: Vitals:   06/10/17 0917  BP: 122/90  Pulse: 71  SpO2: 97%   General: No acute distress Head:  Normocephalic/atraumatic Eyes: Fundoscopic exam shows bilateral sharp discs, no vessel changes, exudates, or hemorrhages Neck: supple, no paraspinal tenderness, full range of motion Back: No paraspinal tenderness Heart: regular rate and  rhythm Lungs: Clear to auscultation bilaterally. Vascular: No carotid bruits. Skin/Extremities: No rash, no edema Neurological Exam: Mental status: alert and oriented to person, place, and time, no dysarthria or aphasia, Fund of knowledge is appropriate.  Recent and remote memory are intact. 2/3 delayed recall.  Attention and concentration are normal.    Able to name objects and repeat phrases. Cranial nerves: CN I: not tested CN II: pupils equal, round and reactive to light, visual fields intact, fundi unremarkable. CN III, IV, VI:  full range of motion, no nystagmus, no ptosis CN V: decreased light touch on right cheek, increased cold sensation, intact pin CN VII: upper and lower face symmetric CN VIII: hearing intact to finger rub CN IX, X: gag intact, uvula midline CN XI: sternocleidomastoid and trapezius muscles intact CN XII: tongue midline Bulk & Tone: normal, no fasciculations. Motor:  5/5 throughout with no pronator drift. Sensation: intact to light touch, cold, pin, vibration and joint position sense.  No extinction to double simultaneous stimulation.  Romberg test negative Deep Tendon Reflexes: +2 throughout, no ankle clonus Plantar responses: downgoing bilaterally Cerebellar: no incoordination on finger to nose, heel to shin. No dysdiadochokinesia Gait: narrow-based and steady, able to tandem walk adequately. Tremor: none  IMPRESSION: This is a pleasant 37 year old left-handed man with a history of anxiety, presenting for recurrent spells of dizziness occurring multiple times a day with associated headache. He is also reporting episodes of "dazes" and feels tired after. The etiology of symptoms is unclear, complicated migraines are considered, less likely seizures. MRI brain with and without contrast and a 1-hour sleep-deprived EEG will be ordered to assess for focal abnormalities that increase risk for recurrent seizures. If EEG is normal, we will plan for  24-hour EEG to further classify his symptoms. We discussed how anxiety could also cause similar symptoms, he will increase Effexor 37.5mg  to 2 caps at night. This may help with potential migraines as well. He will follow-up after the tests and knows to call for any changes.   Thank you for allowing me to participate in the care of this patient. Please do not hesitate to call for any questions or concerns.   Patrcia DollyKaren Aquino, M.D.  CC: [facility]

## 2017-06-10 NOTE — Patient Instructions (Addendum)
1. 1. Schedule MRI brain with and without contrast  [facility] and they will call you directly to schedule your appt. They are located at [address] If you need to contact them directly please call [phone].   2. Schedule 1-hour sleep-deprived EEG. If normal, we will will plan for a 24-hour EEG 3. Increase Venlafaxine 37.5mg : Take 2 caps at night 4. Follow-up after tests, call for any changes

## 2017-06-16 ENCOUNTER — Ambulatory Visit (INDEPENDENT_AMBULATORY_CARE_PROVIDER_SITE_OTHER): Payer: BLUE CROSS/BLUE SHIELD | Admitting: Neurology

## 2017-06-16 DIAGNOSIS — F419 Anxiety disorder, unspecified: Secondary | ICD-10-CM

## 2017-06-16 DIAGNOSIS — R519 Headache, unspecified: Secondary | ICD-10-CM

## 2017-06-16 DIAGNOSIS — R42 Dizziness and giddiness: Secondary | ICD-10-CM | POA: Diagnosis not present

## 2017-06-16 DIAGNOSIS — R51 Headache: Secondary | ICD-10-CM

## 2017-06-20 NOTE — Procedures (Signed)
ELECTROENCEPHALOGRAM REPORT  Date of Study: 06/16/2017  Patient's Name: Jonathan PearJoshua H Claggett MRN: [id number] Date of Birth: Jan 30, 1981  Referring Provider: Dr. Patrcia DollyKaren Lenee Franze  Clinical History: This is a 37 year old man with recurrent spells of dizziness multiple times a day, as well as episodes of "dazes" followed by fatigue. EEG for classification.  Medications: Vistaril Percocet Flomax  Technical Summary: A multichannel digital 1-hour sleep-deprived EEG recording measured by the international 10-20 system with electrodes applied with paste and impedances below 5000 ohms performed in our laboratory with EKG monitoring in an awake and asleep patient.  Hyperventilation and photic stimulation were performed.  The digital EEG was referentially recorded, reformatted, and digitally filtered in a variety of bipolar and referential montages for optimal display.    Description: The patient is awake and asleep during the recording.  During maximal wakefulness, there is a symmetric, medium voltage 9.5-10 Hz posterior dominant rhythm that attenuates with eye opening.  The record is symmetric.  During drowsiness and sleep, there is an increase in theta slowing of the background.  Vertex waves and symmetric sleep spindles were seen.  Hyperventilation and photic stimulation did not elicit any abnormalities.  There were no epileptiform discharges or electrographic seizures seen.    EKG lead was unremarkable.  Impression: This 1-hour awake and asleep EEG is normal.    Clinical Correlation: A normal EEG does not exclude a clinical diagnosis of epilepsy.  If further clinical questions remain, prolonged EEG may be helpful.  Clinical correlation is advised.   Patrcia DollyKaren Ivelise Castillo, M.D.

## 2017-06-22 ENCOUNTER — Telehealth: Payer: Self-pay

## 2017-06-22 NOTE — Telephone Encounter (Signed)
-----   Message from Van ClinesKaren M Aquino, MD sent at 06/20/2017  9:31 AM EST ----- Pls let him know the brain wave test was normal. Proceed with 24-hour EEG to capture the episodes he is having and see what the brain waves show. I don't see it scheduled yet, pls inform Darl PikesSusan, thanks

## 2017-06-22 NOTE — Telephone Encounter (Signed)
ERROR

## 2017-06-22 NOTE — Telephone Encounter (Signed)
Spoke with pt relaying message below.  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 Karel JarvisAquino and Ala BentSusan Reid about that.

## 2017-06-23 ENCOUNTER — Telehealth: Payer: Self-pay

## 2017-06-23 ENCOUNTER — Encounter: Payer: Self-pay | Admitting: Neurology

## 2017-06-23 ENCOUNTER — Telehealth: Payer: Self-pay | Admitting: Neurology

## 2017-06-23 NOTE — Telephone Encounter (Signed)
-----   Message from Vallarie MareSusan R Reid sent at 06/23/2017  9:42 AM EST ----- Pt was rude stating he isn't doing it if we can't do it on a weekend. He stated several times he works for a living and does not want to take off work. I tried to explain but he cut me off several times repeating that he does not understand why we can't do it and that he works for a living. So I told him I would place a note to you that he will not do this test. Darl PikesSusan  ----- Message ----- From: Westley Footsostello, Jakori Burkett S Sent: 06/23/2017   9:03 AM To: Lyndal PulleySusan R Reid  Hey Susan,   This is the pt who needs a 24 hour EEG, but wants to have it done over the weekend if possible. Thanks!

## 2017-06-23 NOTE — Telephone Encounter (Signed)
LMOM advising pt of available times for ambulatory EEG (Jan 25th - jan 28th OR feb 1st - 4th).  Asked for return call

## 2017-06-23 NOTE — Telephone Encounter (Signed)
I called to schedule a 24 hour ambulatory EEG appointment. Mr. Jonathan Turner stated he isn't doing it if we can't do it on a weekend. He stated several times he works for a living and does not want to take off work. I tried to explain but he cut me off several times repeating that he does not understand why we can't do it over the weekend and that he works for a living. I told him it was ultimately his choice, I respect his choice and that I would place a note in his chart to that effect.  Darl PikesSusan

## 2017-07-08 ENCOUNTER — Ambulatory Visit (INDEPENDENT_AMBULATORY_CARE_PROVIDER_SITE_OTHER)
Admission: RE | Admit: 2017-07-08 | Discharge: 2017-07-08 | Disposition: A | Discharge status: Home / Self Care | Payer: BLUE CROSS/BLUE SHIELD | Source: Ambulatory Visit | Attending: Pulmonary Disease | Admitting: Pulmonary Disease

## 2017-07-08 ENCOUNTER — Other Ambulatory Visit (INDEPENDENT_AMBULATORY_CARE_PROVIDER_SITE_OTHER): Payer: BLUE CROSS/BLUE SHIELD

## 2017-07-08 ENCOUNTER — Ambulatory Visit (INDEPENDENT_AMBULATORY_CARE_PROVIDER_SITE_OTHER): Payer: BLUE CROSS/BLUE SHIELD | Admitting: Pulmonary Disease

## 2017-07-08 ENCOUNTER — Encounter: Payer: Self-pay | Admitting: Pulmonary Disease

## 2017-07-08 VITALS — BP 130/72 | HR 67 | Ht 69.0 in | Wt 191.6 lb

## 2017-07-08 DIAGNOSIS — R0600 Dyspnea, unspecified: Secondary | ICD-10-CM

## 2017-07-08 LAB — CBC WITH DIFFERENTIAL/PLATELET
BASOS ABS: 0.2 10*3/uL — AB (ref 0.0–0.1)
Basophils Relative: 1.9 % (ref 0.0–3.0)
Eosinophils Absolute: 0.2 10*3/uL (ref 0.0–0.7)
Eosinophils Relative: 2.4 % (ref 0.0–5.0)
HCT: 44.6 % (ref 39.0–52.0)
Hemoglobin: 15.1 g/dL (ref 13.0–17.0)
LYMPHS ABS: 2.8 10*3/uL (ref 0.7–4.0)
Lymphocytes Relative: 34.6 % (ref 12.0–46.0)
MCHC: 33.8 g/dL (ref 30.0–36.0)
MCV: 86.4 fl (ref 78.0–100.0)
MONOS PCT: 12.7 % — AB (ref 3.0–12.0)
Monocytes Absolute: 1 10*3/uL (ref 0.1–1.0)
NEUTROS ABS: 3.9 10*3/uL (ref 1.4–7.7)
NEUTROS PCT: 48.4 % (ref 43.0–77.0)
PLATELETS: 327 10*3/uL (ref 150.0–400.0)
RBC: 5.16 Mil/uL (ref 4.22–5.81)
RDW: 13.7 % (ref 11.5–15.5)
WBC: 8 10*3/uL (ref 4.0–10.5)

## 2017-07-08 LAB — NITRIC OXIDE

## 2017-07-08 MED ORDER — BUDESONIDE-FORMOTEROL FUMARATE 160-4.5 MCG/ACT IN AERO: 2.0000 | INHALATION_SPRAY | Freq: Two times a day (BID) | RESPIRATORY_TRACT | 6 refills | Status: DC

## 2017-07-08 NOTE — Progress Notes (Signed)
Jonathan Turner    [id number]    04/17/1981  [facility] Physician:[facility]  Ref[facility] Physician: [facility] [address] [place] [id number]  Chief complaint: Consult for dyspnea  HPI: 37 year old with history of hypertension, GERD, esophageal ulcer.  Has complains of dyspnea for the past 4 months.  Started as bronchitis with cough and green mucus.  He was treated with antibiotics.  His cough got better but he still has dyspnea with exertion, mild symptoms at rest.  He has chronic cough with white mucus production. He was seen at urgent care and apparently had spirometry done.  He was told he has asthma and given albuterol inhaler.  He cannot tell if this is helping He has history of acid reflux and esophageal ulcer.  Currently on omeprazole.  Denies any seasonal allergies.  None  Pets: Has a cat, No dogs, birds, farm animals Occupation: Works as a truck travel. Exposures: Exposed to dust and fumes in his line of work.  No asbestos exposure, no mold, no hot tubs Smoking history: 10-pack-year smoking history.  Quit in 2013 Travel History: Not significant  Outpatient Encounter Medications as of 07/08/2017  Medication Sig  . albuterol (PROVENTIL HFA;VENTOLIN HFA) 108 (90 Base) MCG/ACT inhaler Inhale 1 puff into the lungs every 6 (six) hours as needed for wheezing or shortness of breath.  Marland Kitchen. buPROPion (WELLBUTRIN) 100 MG tablet Take 100 mg by mouth daily.  . Testosterone POWD by Does not apply route.  . [DISCONTINUED] hydrOXYzine (VISTARIL) 50 MG capsule Take 50 mg by mouth 3 (three) times daily as needed for anxiety.   . [DISCONTINUED] LORazepam (ATIVAN) 0.5 MG tablet Take by mouth.  . [DISCONTINUED] omeprazole (PRILOSEC) 40 MG capsule   . [DISCONTINUED] sucralfate (CARAFATE) 1 g tablet   . [DISCONTINUED] venlafaxine XR (EFFEXOR-XR) 37.5 MG 24 hr capsule Take 2 caps at night (Patient not taking: Reported on 07/08/2017)   No  facility-administered encounter medications on file as of 07/08/2017.     Allergies as of 07/08/2017 - Review Complete 07/08/2017  Allergen Reaction Noted  . Codeine Other (See Comments) 10/03/2013    Past Medical History:  Diagnosis Date  . Anxiety   . Chronic kidney disease    kidney stones    Past Surgical History:  Procedure Laterality Date  . MANDIBLE FRACTURE SURGERY      Family History  Problem Relation Age of Onset  . Hypertension Father   . Asthma Father     Social History   Socioeconomic History  . Marital status: Legally Separated    Spouse name: Not on file  . Number of children: Not on file  . Years of education: Not on file  . Highest education level: Not on file  Social Needs  . Financial resource strain: Not on file  . Food insecurity - worry: Not on file  . Food insecurity - inability: Not on file  . Transportation needs - medical: Not on file  . Transportation needs - non-medical: Not on file  Occupational History  . Not on file  Tobacco Use  . Smoking status: Former Smoker    Types: Cigarettes    Last attempt to quit: 06/07/2013    Years since quitting: 4.0  . Smokeless tobacco: Former NeurosurgeonUser    Types: Chew  Substance and Sexual Activity  . Alcohol use: No    Alcohol/week: 16.8 oz    Types: 28 Cans of beer per week  .  Drug use: No  . Sexual activity: Not on file  Other Topics Concern  . Not on file  Social History Narrative   Pt lives alone in 1 story home   Has 3 children   Trade school   Works as Designer, multimedia route truck driver   Review of systems: Review of Systems  Constitutional: Negative for fever and chills.  HENT: Negative.   Eyes: Negative for blurred vision.  Respiratory: as per HPI  Cardiovascular: Negative for chest pain and palpitations.  Gastrointestinal: Negative for vomiting, diarrhea, blood per rectum. Genitourinary: Negative for dysuria, urgency, frequency and hematuria.  Musculoskeletal: Negative for myalgias, back pain  and joint pain.  Skin: Negative for itching and rash.  Neurological: Negative for dizziness, tremors, focal weakness, seizures and loss of consciousness.  Endo/Heme/Allergies: Negative for environmental allergies.  Psychiatric/Behavioral: Negative for depression, suicidal ideas and hallucinations.  All other systems reviewed and are negative.  Physical Exam: Blood pressure 130/72, pulse 67, height 5\' 9"  (1.753 m), weight 191 lb 9.6 oz (86.9 kg), SpO2 96 %. Gen:      No acute distress HEENT:  EOMI, sclera anicteric Neck:     No masses; no thyromegaly Lungs:    Clear to auscultation bilaterally; normal respiratory effort CV:         Regular rate and rhythm; no murmurs Abd:      + bowel sounds; soft, non-tender; no palpable masses, no distension Ext:    No edema; adequate peripheral perfusion Skin:      Warm and dry; no rash Neuro: alert and oriented x 3 Psych: normal mood and affect  Data Reviewed: Chest x-ray 06/24/15-clear lungs CT abdomen pelvis 06/17/16- minimal atelectasis left base.  Otherwise clear lungs. I have reviewed the images personally.  FENO 07/08/17-13  Assessment:  Evaluation for asthma Symptoms are not very typical of asthma and FENO is low. History noted for occupational exposure to fumes and dust We will chest x-ray to evaluate intersitial lung disease and PFTs, CBC differential and blood allergy profile Continue albuterol inhaler.  Give samples of Symbicort Follow-up in 1-2 weeks to review tests and assess response to therapy  Continue Prilosec for GERD. Obtain results from [facility] medical center.  Plan/Recommendations: - CXR, PFTs, CBC with diff, blood allergy profile - Continue albuterol, samples of symbicort. - Get records from [facility].  Chilton Greathouse MD [facility] Pager [phone] 07/08/2017, 3:25 PM  CC: [facility]

## 2017-07-08 NOTE — Patient Instructions (Addendum)
We will get blood test today including CBC differential, blood allergy profile Get a chest x-ray and pulmonary function test Continue albuterol inhaler.  We will give samples of Symbicort Follow-up in 1-2 weeks.

## 2017-07-19 ENCOUNTER — Telehealth: Payer: Self-pay | Admitting: Pulmonary Disease

## 2017-07-19 NOTE — Telephone Encounter (Signed)
Answered with result note

## 2017-07-19 NOTE — Telephone Encounter (Signed)
Looked at the results from pt's last cxr.    Pt had numerous questions with regards that he had been told in the past he had a bundle branch block and he was wondering if this was contributing to his continued SOB.  Pt was also wanting to know if he needed to remain on the symbicort 160 which was prescribed during his last OV.  Dr. Isaiah SergeMannam, please advise on this for pt.   Thanks!

## 2017-07-27 ENCOUNTER — Ambulatory Visit: Payer: Self-pay | Admitting: Adult Health

## 2017-12-27 ENCOUNTER — Encounter: Payer: Self-pay | Admitting: Neurology

## 2018-02-21 ENCOUNTER — Telehealth: Payer: Self-pay

## 2018-02-21 NOTE — Telephone Encounter (Signed)
Pt had a new patient referral sent to our office for headaches.  He was seen in Jan by you, had 1hr EEG and 24hr was recommended.  He was rude with Darl PikesSusan about scheduling and never had 24hr EEG.  Should he be seen as f/u?  And should he have 24hr EEG done prior to visit?  Please advise.

## 2018-02-22 NOTE — Telephone Encounter (Signed)
Ok for 9/19 slot, if patient willing. Thanks

## 2018-02-22 NOTE — Telephone Encounter (Signed)
Appointment scheduled.

## 2018-02-23 ENCOUNTER — Ambulatory Visit: Payer: Self-pay | Admitting: Neurology

## 2018-02-23 ENCOUNTER — Other Ambulatory Visit: Payer: Self-pay

## 2018-02-23 ENCOUNTER — Ambulatory Visit: Payer: BLUE CROSS/BLUE SHIELD | Admitting: Neurology

## 2018-02-23 ENCOUNTER — Encounter: Payer: Self-pay | Admitting: Neurology

## 2018-02-23 ENCOUNTER — Encounter

## 2018-02-23 VITALS — BP 130/88 | HR 80 | Ht 68.0 in | Wt 188.0 lb

## 2018-02-23 DIAGNOSIS — R42 Dizziness and giddiness: Secondary | ICD-10-CM | POA: Diagnosis not present

## 2018-02-23 DIAGNOSIS — G43109 Migraine with aura, not intractable, without status migrainosus: Secondary | ICD-10-CM | POA: Diagnosis not present

## 2018-02-23 MED ORDER — TOPIRAMATE 25 MG PO TABS: ORAL_TABLET | ORAL | 11 refills | Status: AC

## 2018-02-23 NOTE — Progress Notes (Signed)
NEUROLOGY FOLLOW UP OFFICE NOTE  Jonathan PearJoshua H Turner [id number] 03/21/1981  HISTORY OF PRESENT ILLNESS: I had the pleasure of seeing Jonathan Turner in follow-up in the neurology clinic on 02/23/2018.  The patient was last seen 8 months ago for headaches, dizziness, and facial tingling. He is alone in the office today. Records and images were personally reviewed where available.  His 1-hour sleep-deprived EEG was normal. He states he had the MRI brain, but we do not have any records of it. He had scheduling difficulties with the prolonged EEG. He states symptoms are the same as his last visit, except they seem worse. He is having brief dizzy spells 2-3 times a day, usually with positional changes. He is having headaches on a daily basis, mostly on the right frontal region. He sees black squiggly lines in both eyes, recent eye exam was normal. He had side effects on Effexor with flattening of emotions, which he did not like. He is not taking any medication for anxiety. He denies any neck/back pain, he has right shoulder pain from recent fall from his truck. No focal numbness/tingling/weakness. He denies any associated nausea/vomiting, photo/phonophobia with the headaches. There is a strong family history of migraines.   History on Initial Assessment 06/10/2017: This is a pleasant 37 year old left-handed man with a history of anxiety, presenting for evaluation of headache, dizziness, and facial tingling. He feels that anxiety plays a role, he is under a lot of stress going through a divorce, but states he had symptoms even before the divorce. He was having right-sided headaches with sharp pains that stopped after he had tooth repair done 2 months ago. He however continues to report episodes where he has a dizzy spell described as a sensation of movement. There is no clear positional component. He would have numbness on the right temple beside his eye, sometimes tingling, followed by a mild headache. Previously the  headaches would be so overwhelming, but since the tooth repair, this is better. He feels tired after. There is no associated nausea/vomiting, photo/phonophobia. He sometimes sees little black squiggly lines. He was started on Effexor due to the anxiety he is having with the divorce, he states this has calmed down a lot but "the divorce is killing me." These episodes occur multiple times a day, up to 6 times a day, lasting 10-15 minutes at a time. No clear triggers. He has them even if he had good sleep. He is working on alcohol intake, he drinks 2 beers a day now, but he stopped alcohol for a year but still had these episodes. 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 He feels that as long as he is moving, he is fine, but once he stops, it feels like the world is moving. He is also reporting "dazed episodes" where he gets stuck in "dazes," there is a black cloud on his peripheral vision, this occurs around 4-5 times a day for several years. He feels tired after. He denies any olfactory/gustatory hallucinations, rising epigastric sensation, myoclonic jerks. He reports a nervous tic where he feels a nervous twitch in his nose or involuntary need to shake his head, but this has not happened recently.  His paternal grandfather and mother have migraines. He denies any prior history of migraines. He has terrible anxiety. There were times he would fall over in school like if someone was laughing at him, he fell flat on  his face and somewhat passed out in 2001. He has floaters in his vision all the time. He has constant numbness in the 3rd and 4th digits of his right hand since a neck injury. He has prn Ativan 1/2 tablet which he takes every 2 weeks, but it makes him feel like he is in another world. He has seen several physicians in the past for numbness and dizziness, and had an MRI brain without contrast in 02/2016 which I personally reviewed, no acute  changes PAST MEDICAL HISTORY: Past Medical History:  Diagnosis Date  . Anxiety   . Chronic kidney disease    kidney stones    MEDICATIONS: Current Outpatient Medications on File Prior to Visit  Medication Sig Dispense Refill  . albuterol (PROVENTIL HFA;VENTOLIN HFA) 108 (90 Base) MCG/ACT inhaler Inhale 1 puff into the lungs every 6 (six) hours as needed for wheezing or shortness of breath.    . budesonide-formoterol (SYMBICORT) 160-4.5 MCG/ACT inhaler Inhale 2 puffs into the lungs 2 (two) times daily. 1 Inhaler 6  . buPROPion (WELLBUTRIN) 100 MG tablet Take 100 mg by mouth daily.    . Testosterone POWD by Does not apply route.     No current facility-administered medications on file prior to visit.     ALLERGIES: Allergies  Allergen Reactions  . Codeine Other (See Comments)     Goes in to nuclear meltdown- gets irritable    FAMILY HISTORY: Family History  Problem Relation Age of Onset  . Hypertension Father   . Asthma Father     SOCIAL HISTORY: Social History   Socioeconomic History  . Marital status: Legally Separated    Spouse name: Not on file  . Number of children: Not on file  . Years of education: Not on file  . Highest education level: Not on file  Occupational History  . Not on file  Social Needs  . Financial resource strain: Not on file  . Food insecurity:    Worry: Not on file    Inability: Not on file  . Transportation needs:    Medical: Not on file    Non-medical: Not on file  Tobacco Use  . Smoking status: Former Smoker    Types: Cigarettes    Last attempt to quit: 06/07/2013    Years since quitting: 4.7  . Smokeless tobacco: Former Neurosurgeon    Types: Chew  Substance and Sexual Activity  . Alcohol use: No    Alcohol/week: 28.0 standard drinks    Types: 28 Cans of beer per week  . Drug use: No  . Sexual activity: Not on file  Lifestyle  . Physical activity:    Days per week: Not on file    Minutes per session: Not on file  . Stress: Not on  file  Relationships  . Social connections:    Talks on phone: Not on file    Gets together: Not on file    Attends religious service: Not on file    Active member of club or organization: Not on file    Attends meetings of clubs or organizations: Not on file    Relationship status: Not on file  . Intimate partner violence:    Fear of current or ex partner: Not on file    Emotionally abused: Not on file    Physically abused: Not on file    Forced sexual activity: Not on file  Other Topics Concern  . Not on file  Social History Narrative   Pt  lives alone in 1 story home   Has 3 children   Trade school   Works as Probation officer    REVIEW OF SYSTEMS: Constitutional: No fevers, chills, or sweats, no generalized fatigue, change in appetite Eyes: No visual changes, double vision, eye pain Ear, nose and throat: No hearing loss, ear pain, nasal congestion, sore throat Cardiovascular: No chest pain, palpitations Respiratory:  No shortness of breath at rest or with exertion, wheezes GastrointestinaI: No nausea, vomiting, diarrhea, abdominal pain, fecal incontinence Genitourinary:  No dysuria, urinary retention or frequency Musculoskeletal:  No neck pain, back pain Integumentary: No rash, pruritus, skin lesions Neurological: as above Psychiatric: No depression, insomnia, anxiety Endocrine: No palpitations, fatigue, diaphoresis, mood swings, change in appetite, change in weight, increased thirst Hematologic/Lymphatic:  No anemia, purpura, petechiae. Allergic/Immunologic: no itchy/runny eyes, nasal congestion, recent allergic reactions, rashes  PHYSICAL EXAM: Vitals:   02/23/18 1133  BP: 130/88  Pulse: 80  SpO2: 99%   General: No acute distress Head:  Normocephalic/atraumatic Neck: supple, no paraspinal tenderness, full range of motion Heart:  Regular rate and rhythm Lungs:  Clear to auscultation bilaterally Back: No paraspinal tenderness Skin/Extremities: No rash, no  edema Neurological Exam: alert and oriented to person, place, and time. No aphasia or dysarthria. Fund of knowledge is appropriate.  Recent and remote memory are intact.  Attention and concentration are normal.    Able to name objects and repeat phrases. Cranial nerves: Pupils equal, round, reactive to light.  Fundoscopic exam unremarkable, no papilledema. Extraocular movements intact with no nystagmus. Visual fields full. Facial sensation intact. No facial asymmetry. Tongue, uvula, palate midline.  Motor: Bulk and tone normal, muscle strength 5/5 throughout with no pronator drift.  Sensation to light touch intact.  No extinction to double simultaneous stimulation.  Deep tendon reflexes +2 throughout, toes downgoing.  Finger to nose testing intact.  Gait narrow-based and steady, able to tandem walk adequately.  Romberg negative.  IMPRESSION: This is a pleasant 37 yo LH man with a history of anxiety, who initially presented for recurrent spells of dizziness occurring multiple times a day with associated headache. He is also reporting episodes of "dazes" and feels tired after. His 1-hour EEG is normal. He reports having an MRI done recently, we cannot find records of this and will track it down, he did have a normal MRI in 2017. We discussed the possibility of vertiginous migraines as the cause of his symptoms, he is agreeable to starting a daily migraine preventative medication. Options were discussed, he does not want to be on an antidepressant. He will start Topamax 25mg  qhs x 1 week, then uptitrate every week to 75mg  qhs. We may increase dose further as tolerated. Other option is Propranolol. He will call our office for an update in 2 months, then follow-up in 4 months. He knows to call for any changes.   Thank you for allowing me to participate in his care.  Please do not hesitate to call for any questions or concerns.  The duration of this appointment visit was 26 minutes of face-to-face time with the  patient.  Greater than 50% of this time was spent in counseling, explanation of diagnosis, planning of further management, and coordination of care.   Jonathan Turner, M.D.   CC: Rhetta Mura, PA-C

## 2018-02-23 NOTE — Patient Instructions (Addendum)
1. Start Topamax 25mg : take 1 tablet at night for 1 week, then increase to 2 tablets every night for 1 week, then increase to 3 tablets every night. Call our office for an update in 2 months on how you are feeling, we can further increase dose if needed  2. We will track down the MRI, if we cannot find it, we will have to schedule it again  3. Follow-up in 4 months, call for any changes

## 2018-03-13 ENCOUNTER — Other Ambulatory Visit: Payer: Self-pay

## 2018-03-13 ENCOUNTER — Telehealth: Payer: Self-pay | Admitting: Neurology

## 2018-03-13 NOTE — Telephone Encounter (Signed)
Patient called in stating he has had a Migraine since 2AM. He was having some Tongue swelling when taking the Topamax medication. He said his grandfather was taking Migerone for migraines and wanted to know if that was an option. Please call him back at [phone].  Thanks!

## 2018-03-13 NOTE — Telephone Encounter (Signed)
Patient called back needing to speak with you regarding his Migraine. Please Call. Thanks

## 2018-03-13 NOTE — Telephone Encounter (Signed)
I think he is talking about Migranal? He can have samples, pls let him know that the Migranal is an as needed medication to take at the onset of migraine, it is not a daily medication. He can only use it 2-3 times a week. Thanks

## 2018-03-13 NOTE — Telephone Encounter (Signed)
Spoke with pt.  He states that he is also experiencing dizziness with this migraine.  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  He would like this.

## 2018-03-16 ENCOUNTER — Telehealth: Payer: Self-pay | Admitting: Neurology

## 2018-03-16 NOTE — Telephone Encounter (Signed)
Patient had called and said thank you for the medication. He is feeling much better. He said he has some samples left but would like the medication called in please. Thanks

## 2018-03-17 MED ORDER — DIHYDROERGOTAMINE MESYLATE 4 MG/ML NA SOLN: 1.0000 | NASAL | 12 refills | Status: AC | PRN

## 2018-03-17 NOTE — Telephone Encounter (Signed)
MIGRANAL 4 MG/ML nasal spray Dispense 8mL with 12 refills Sig = Place 1 spray into the nose as needed for migraine (at onset of migraine). No more than 4 sprays in one hour    Rx sent to [facility] on [address]. LMOM making pt aware

## 2018-06-21 ENCOUNTER — Ambulatory Visit: Payer: Self-pay | Admitting: Neurology

## 2018-11-24 ENCOUNTER — Emergency Department (HOSPITAL_COMMUNITY)
Admission: EM | Admit: 2018-11-24 | Discharge: 2018-11-24 | Disposition: A | Discharge status: Home / Self Care | Payer: BLUE CROSS/BLUE SHIELD | Attending: Emergency Medicine | Admitting: Emergency Medicine

## 2018-11-24 ENCOUNTER — Other Ambulatory Visit: Payer: Self-pay

## 2018-11-24 ENCOUNTER — Encounter (HOSPITAL_COMMUNITY): Payer: Self-pay | Admitting: *Deleted

## 2018-11-24 DIAGNOSIS — K047 Periapical abscess without sinus: Secondary | ICD-10-CM | POA: Insufficient documentation

## 2018-11-24 DIAGNOSIS — Z87891 Personal history of nicotine dependence: Secondary | ICD-10-CM | POA: Insufficient documentation

## 2018-11-24 DIAGNOSIS — Z79899 Other long term (current) drug therapy: Secondary | ICD-10-CM | POA: Insufficient documentation

## 2018-11-24 MED ORDER — BUPIVACAINE HCL (PF) 0.25 % IJ SOLN
20.0000 mL | Freq: Once | INTRAMUSCULAR | Status: AC
  Administered 2018-11-24: 20 mL
  Filled 2018-11-24: qty 30

## 2018-11-24 MED ORDER — IBUPROFEN 400 MG PO TABS
600.0000 mg | ORAL_TABLET | Freq: Once | ORAL | Status: AC
  Administered 2018-11-24: 600 mg via ORAL
  Filled 2018-11-24: qty 1

## 2018-11-24 MED ORDER — IBUPROFEN 600 MG PO TABS: 600.0000 mg | ORAL_TABLET | Freq: Three times a day (TID) | ORAL | 0 refills | Status: DC | PRN

## 2018-11-24 MED ORDER — PENICILLIN V POTASSIUM 500 MG PO TABS: 500.0000 mg | ORAL_TABLET | Freq: Three times a day (TID) | ORAL | 0 refills | Status: AC

## 2018-11-24 MED ORDER — PENICILLIN V POTASSIUM 250 MG PO TABS
500.0000 mg | ORAL_TABLET | Freq: Once | ORAL | Status: AC
  Administered 2018-11-24: 500 mg via ORAL
  Filled 2018-11-24: qty 2

## 2018-11-24 NOTE — ED Provider Notes (Signed)
[facility]   CSN: [id number] Arrival date & time: 11/24/18  0705     History   Chief Complaint Chief Complaint  Patient presents with  . Dental Pain    HPI Jonathan Turner is a 38 y.o. male.     HPI Patient is a 38 year old male who presents the emergency department with right upper dental pain and right-sided facial swelling worsening over the past several days.  Denies fevers and chills.  No difficulty breathing or swallowing.  Has not seen a dentist.  Believes he may have a crack in his right upper tooth.  No other complaints at this time.  Pain is moderate to severe in severity.   Past Medical History:  Diagnosis Date  . Anxiety   . Chronic kidney disease    kidney stones    Patient Active Problem List   Diagnosis Date Noted  . Thrombosed external hemorrhoid 10/04/2013    Past Surgical History:  Procedure Laterality Date  . MANDIBLE FRACTURE SURGERY          Home Medications    Prior to Admission medications   Medication Sig Start Date End Date Taking? Authorizing Provider  albuterol (PROVENTIL HFA;VENTOLIN HFA) 108 (90 Base) MCG/ACT inhaler Inhale 1 puff into the lungs every 6 (six) hours as needed for wheezing or shortness of breath.    [provider]  budesonide-formoterol (SYMBICORT) 160-4.5 MCG/ACT inhaler Inhale 2 puffs into the lungs 2 (two) times daily. 07/08/17   Mannam, Colbert CoyerPraveen, MD  buPROPion (WELLBUTRIN) 100 MG tablet Take 100 mg by mouth daily.    [provider]  dihydroergotamine (MIGRANAL) 4 MG/ML nasal spray Place 1 spray into the nose as needed for migraine (at onset of migraine). No more than 4 sprays in one hour 03/17/18   Van ClinesAquino, Karen M, MD  Testosterone POWD by Does not apply route.    [provider]  topiramate (TOPAMAX) 25 MG tablet Take 1 tablet every night for 1 week, then increase to 2 tablets every night for 1 week, then increase to 3 tablets every night  02/23/18   Van ClinesAquino, Karen M, MD    Family History Family History  Problem Relation Age of Onset  . Hypertension Father   . Asthma Father     Social History Social History   Tobacco Use  . Smoking status: Former Smoker    Types: Cigarettes    Quit date: 06/07/2013    Years since quitting: 5.4  . Smokeless tobacco: Former NeurosurgeonUser    Types: Chew  Substance Use Topics  . Alcohol use: Yes    Alcohol/week: 28.0 standard drinks    Types: 28 Cans of beer per week  . Drug use: No     Allergies   Codeine   Review of Systems Review of Systems  All other systems reviewed and are negative.    Physical Exam Updated Vital Signs BP (!) 150/99 (BP Location: Right Arm)   Pulse 67   Temp 98.1 F (36.7 C) (Oral)   Resp 16   Ht 5\' 9"  (1.753 m)   Wt 87.1 kg   SpO2 98%   BMI 28.35 kg/m   Physical Exam Vitals signs and nursing note reviewed.  Constitutional:      Appearance: He is well-developed.  HENT:     Head: Normocephalic.     Comments: Right sided facial swelling. Dental decay at right upper 3rd molar without gingival swelling. Space under his tongue is  soft. Anterior neck normal Neck:     Musculoskeletal: Normal range of motion.  Pulmonary:     Effort: Pulmonary effort is normal.  Abdominal:     General: There is no distension.  Musculoskeletal: Normal range of motion.  Neurological:     Mental Status: He is alert and oriented to person, place, and time.      ED Treatments / Results  Labs (all labs ordered are listed, but only abnormal results are displayed) Labs Reviewed - No data to display  EKG    Radiology No results found.  Procedures Dental Block Performed by: Jola Schmidt, MD Authorized by: Jola Schmidt, MD   Consent:    Consent obtained:  Verbal   Consent given by:  Patient Indications:    Indications: dental pain   Location:    Block type:  Supraperiosteal   Supraperiosteal location:  Upper teeth   Upper teeth location:  1/RU 3rd molar  Procedure details (see MAR for exact dosages):    Syringe type:  Controlled syringe   Needle gauge:  25 G   Anesthetic injected:  Bupivacaine 0.25% w/o epi Post-procedure details:    Outcome:  Pain improved   Patient tolerance of procedure:  Tolerated well, no immediate complications   (including critical care time)  Medications Ordered in ED Medications  bupivacaine (PF) (MARCAINE) 0.25 % injection 20 mL (20 mLs Infiltration Given by Other 11/24/18 0758)  ibuprofen (ADVIL) tablet 600 mg (600 mg Oral Given 11/24/18 0757)  penicillin v potassium (VEETID) tablet 500 mg (500 mg Oral Given 11/24/18 0757)     Initial Impression / Assessment and Plan / ED Course  I have reviewed the triage vital signs and the nursing notes.  Pertinent labs & imaging results that were available during my care of the patient were reviewed by me and considered in my medical decision making (see chart for details).        Dental Pain. Home with antibiotics and pain medicine. Recommend dental follow up. No signs of gingival abscess. Tolerating secretions. Airway patent. No sub lingular swelling   Final Clinical Impressions(s) / ED Diagnoses   Final diagnoses:  Dental infection    ED Discharge Orders         Ordered    penicillin v potassium (VEETID) 500 MG tablet  3 times daily     11/24/18 0831    ibuprofen (ADVIL) 600 MG tablet  Every 8 hours PRN     11/24/18 1610           Jola Schmidt, MD 11/24/18 [phone]

## 2018-11-24 NOTE — Discharge Instructions (Signed)
Please call a dentist for follow up  Take ibuprofen and tylenol for the pain  Complete the entire course of the antibiotics prescribed

## 2018-11-24 NOTE — ED Triage Notes (Signed)
Pt states R upper dental pain and facial swelling for several days that increased last night.  Denies fevers.

## 2018-11-24 NOTE — ED Notes (Signed)
Discharge instruction reviewed, prescription directions provided. No further questions at this time

## 2018-11-29 ENCOUNTER — Encounter (HOSPITAL_COMMUNITY): Payer: Self-pay

## 2018-11-29 ENCOUNTER — Emergency Department (HOSPITAL_COMMUNITY)
Admission: EM | Admit: 2018-11-29 | Discharge: 2018-11-29 | Disposition: A | Discharge status: Home / Self Care | Payer: Self-pay | Attending: Emergency Medicine | Admitting: Emergency Medicine

## 2018-11-29 DIAGNOSIS — Z5321 Procedure and treatment not carried out due to patient leaving prior to being seen by health care provider: Secondary | ICD-10-CM | POA: Insufficient documentation

## 2018-11-29 NOTE — ED Notes (Signed)
Advised patient to stay, patient decided to leave.   

## 2018-11-29 NOTE — ED Notes (Signed)
Pt states that abscess began draining as he was waiting

## 2018-11-29 NOTE — ED Triage Notes (Signed)
Pt here for R upper dental abscess, seen here Friday for the same, has been taking antibiotics without relief.

## 2021-06-04 ENCOUNTER — Ambulatory Visit
Admission: RE | Admit: 2021-06-04 | Discharge: 2021-06-04 | Disposition: A | Discharge status: Home / Self Care | Payer: BC Managed Care – PPO | Source: Ambulatory Visit | Attending: *Deleted | Admitting: *Deleted

## 2021-06-04 ENCOUNTER — Other Ambulatory Visit: Payer: Self-pay | Admitting: *Deleted

## 2021-06-04 ENCOUNTER — Other Ambulatory Visit: Payer: Self-pay

## 2021-06-04 DIAGNOSIS — R1032 Left lower quadrant pain: Secondary | ICD-10-CM

## 2021-06-11 ENCOUNTER — Other Ambulatory Visit: Payer: Self-pay | Admitting: Urology

## 2021-06-11 ENCOUNTER — Other Ambulatory Visit: Payer: Self-pay

## 2021-06-11 ENCOUNTER — Ambulatory Visit (HOSPITAL_BASED_OUTPATIENT_CLINIC_OR_DEPARTMENT_OTHER)
Admission: RE | Admit: 2021-06-11 | Discharge: 2021-06-11 | Disposition: A | Discharge status: Home / Self Care | Payer: BC Managed Care – PPO | Source: Ambulatory Visit | Attending: Urology | Admitting: Urology

## 2021-06-11 ENCOUNTER — Encounter (HOSPITAL_BASED_OUTPATIENT_CLINIC_OR_DEPARTMENT_OTHER)
Admission: RE | Disposition: A | Discharge status: Home / Self Care | Payer: Self-pay | Source: Ambulatory Visit | Attending: Urology

## 2021-06-11 ENCOUNTER — Ambulatory Visit (HOSPITAL_COMMUNITY): Payer: BC Managed Care – PPO

## 2021-06-11 ENCOUNTER — Encounter (HOSPITAL_BASED_OUTPATIENT_CLINIC_OR_DEPARTMENT_OTHER): Payer: Self-pay | Admitting: Urology

## 2021-06-11 DIAGNOSIS — N201 Calculus of ureter: Secondary | ICD-10-CM | POA: Insufficient documentation

## 2021-06-11 DIAGNOSIS — F17290 Nicotine dependence, other tobacco product, uncomplicated: Secondary | ICD-10-CM | POA: Insufficient documentation

## 2021-06-11 HISTORY — DX: Personal history of urinary calculi: Z87.442

## 2021-06-11 HISTORY — PX: EXTRACORPOREAL SHOCK WAVE LITHOTRIPSY: SHX1557

## 2021-06-11 HISTORY — DX: Other complications of anesthesia, initial encounter: T88.59XA

## 2021-06-11 SURGERY — LITHOTRIPSY, ESWL
Anesthesia: LOCAL | Laterality: Left

## 2021-06-11 MED ORDER — DIAZEPAM 5 MG PO TABS
10.0000 mg | ORAL_TABLET | ORAL | Status: AC
  Administered 2021-06-11: 10 mg via ORAL

## 2021-06-11 MED ORDER — CIPROFLOXACIN HCL 500 MG PO TABS
ORAL_TABLET | ORAL | Status: AC
  Filled 2021-06-11: qty 1

## 2021-06-11 MED ORDER — DIPHENHYDRAMINE HCL 25 MG PO CAPS
25.0000 mg | ORAL_CAPSULE | ORAL | Status: AC
  Administered 2021-06-11: 25 mg via ORAL

## 2021-06-11 MED ORDER — DIAZEPAM 5 MG PO TABS
ORAL_TABLET | ORAL | Status: AC
  Filled 2021-06-11: qty 2

## 2021-06-11 MED ORDER — CIPROFLOXACIN HCL 500 MG PO TABS
500.0000 mg | ORAL_TABLET | ORAL | Status: AC
  Administered 2021-06-11: 500 mg via ORAL

## 2021-06-11 MED ORDER — DIPHENHYDRAMINE HCL 25 MG PO CAPS
ORAL_CAPSULE | ORAL | Status: AC
  Filled 2021-06-11: qty 1

## 2021-06-11 MED ORDER — SODIUM CHLORIDE 0.9 % IV SOLN: INTRAVENOUS | Status: DC

## 2021-06-11 NOTE — H&P (Incomplete)
Jonathan Turner is an 41 y.o. male.    Chief Complaint: Pre-OP LEFT Shockwave Lithotripsy  HPI:   1 - Recurrent Urolithiasis -  PRe 2022 - MET x dozens  05/2021 - 49mm left UPJ stone by ER CT, Rt mid punctate only additional ==> medical therapy, KUB 06/2021 with persistance at Left L3L4 intespace.   Today " Jonathan Turner " is seen to proceed with LEFT shockwave lithotripsy. No interval fevers.   Past Medical History:  Diagnosis Date   Anxiety    Chronic kidney disease    kidney stones    Past Surgical History:  Procedure Laterality Date   MANDIBLE FRACTURE SURGERY      Family History  Problem Relation Age of Onset   Hypertension Father    Asthma Father    Social History:  reports that he quit smoking about 8 years ago. His smoking use included cigarettes. He has quit using smokeless tobacco.  His smokeless tobacco use included chew. He reports current alcohol use of about 28.0 standard drinks per week. He reports that he does not use drugs.  Allergies:  Allergies  Allergen Reactions   Codeine Other (See Comments)     Goes in to nuclear meltdown- gets irritable    No medications prior to admission.    No results found for this or any previous visit (from the past 48 hour(s)). No results found.  Review of Systems  Constitutional:  Negative for chills and fever.  Genitourinary:  Positive for flank pain.  All other systems reviewed and are negative.  There were no vitals taken for this visit. Physical Exam Vitals reviewed.  HENT:     Head: Normocephalic.  Eyes:     Pupils: Pupils are equal, round, and reactive to light.  Cardiovascular:     Rate and Rhythm: Normal rate.  Abdominal:     General: Abdomen is flat.     Comments: Mild left CVAT at present.   Musculoskeletal:        General: Normal range of motion.     Cervical back: Normal range of motion.  Neurological:     General: No focal deficit present.     Mental Status: He is alert.  Psychiatric:         Mood and Affect: Mood normal.     Assessment/Plan  Proceed as planned with LEFT shockwave lithotripsy. Risks, benefits, alternatives, expected peri-op course discussed previously and reiterated today.   Alexis Frock, MD 06/11/2021, 10:30 AM

## 2021-06-11 NOTE — Discharge Instructions (Addendum)
1 - You may have urinary urgency (bladder spasms), pass small stone fragments, and bloody urine on / off for up to a week. This is normal.  2 - Call MD or go to ER for fever >102, severe pain / nausea / vomiting not relieved by medications, or acute change in medical status   Post Anesthesia Home Care Instructions  Activity: Get plenty of rest for the remainder of the day. A responsible individual must stay with you for 24 hours following the procedure.  For the next 24 hours, DO NOT: -Drive a car -Advertising copywriter -Drink alcoholic beverages -Take any medication unless instructed by your physician -Make any legal decisions or sign important papers.  Meals: Start with liquid foods such as gelatin or soup. Progress to regular foods as tolerated. Avoid greasy, spicy, heavy foods. If nausea and/or vomiting occur, drink only clear liquids until the nausea and/or vomiting subsides. Call your physician if vomiting continues.  Special Instructions/Symptoms: Your throat may feel dry or sore from the anesthesia or the breathing tube placed in your throat during surgery. If this causes discomfort, gargle with warm salt water. The discomfort should disappear within 24 hours.

## 2021-06-12 ENCOUNTER — Encounter (HOSPITAL_BASED_OUTPATIENT_CLINIC_OR_DEPARTMENT_OTHER): Payer: Self-pay | Admitting: Urology

## 2023-06-06 IMAGING — DX DG ABDOMEN 1V
2 series · 2 of 2 positions shown · non-contrast
Comparison: KUB June 09, 2021.  CT scan June 04, 2021.

CLINICAL DATA: Follow-up known left ureteral stone.

EXAM:
ABDOMEN - 1 VIEW

[abdomen kub (1 of 2)]
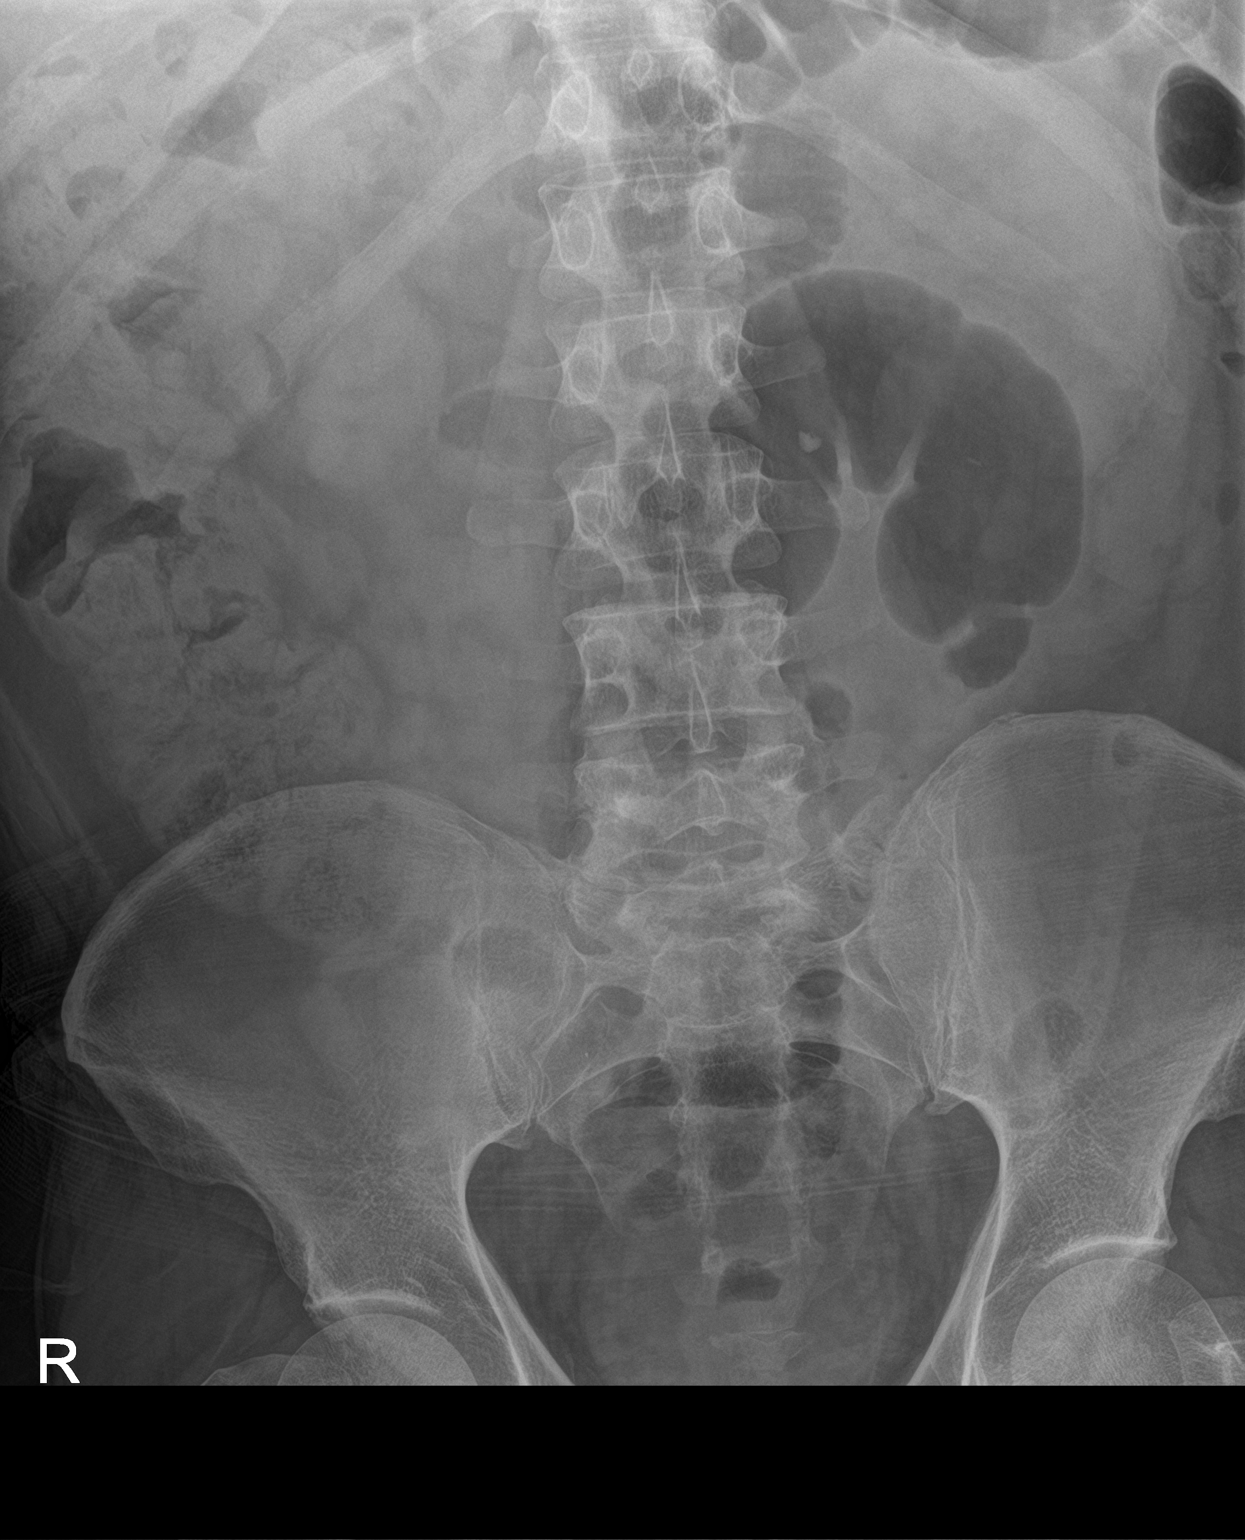

[abdomen kub (2 of 2)]
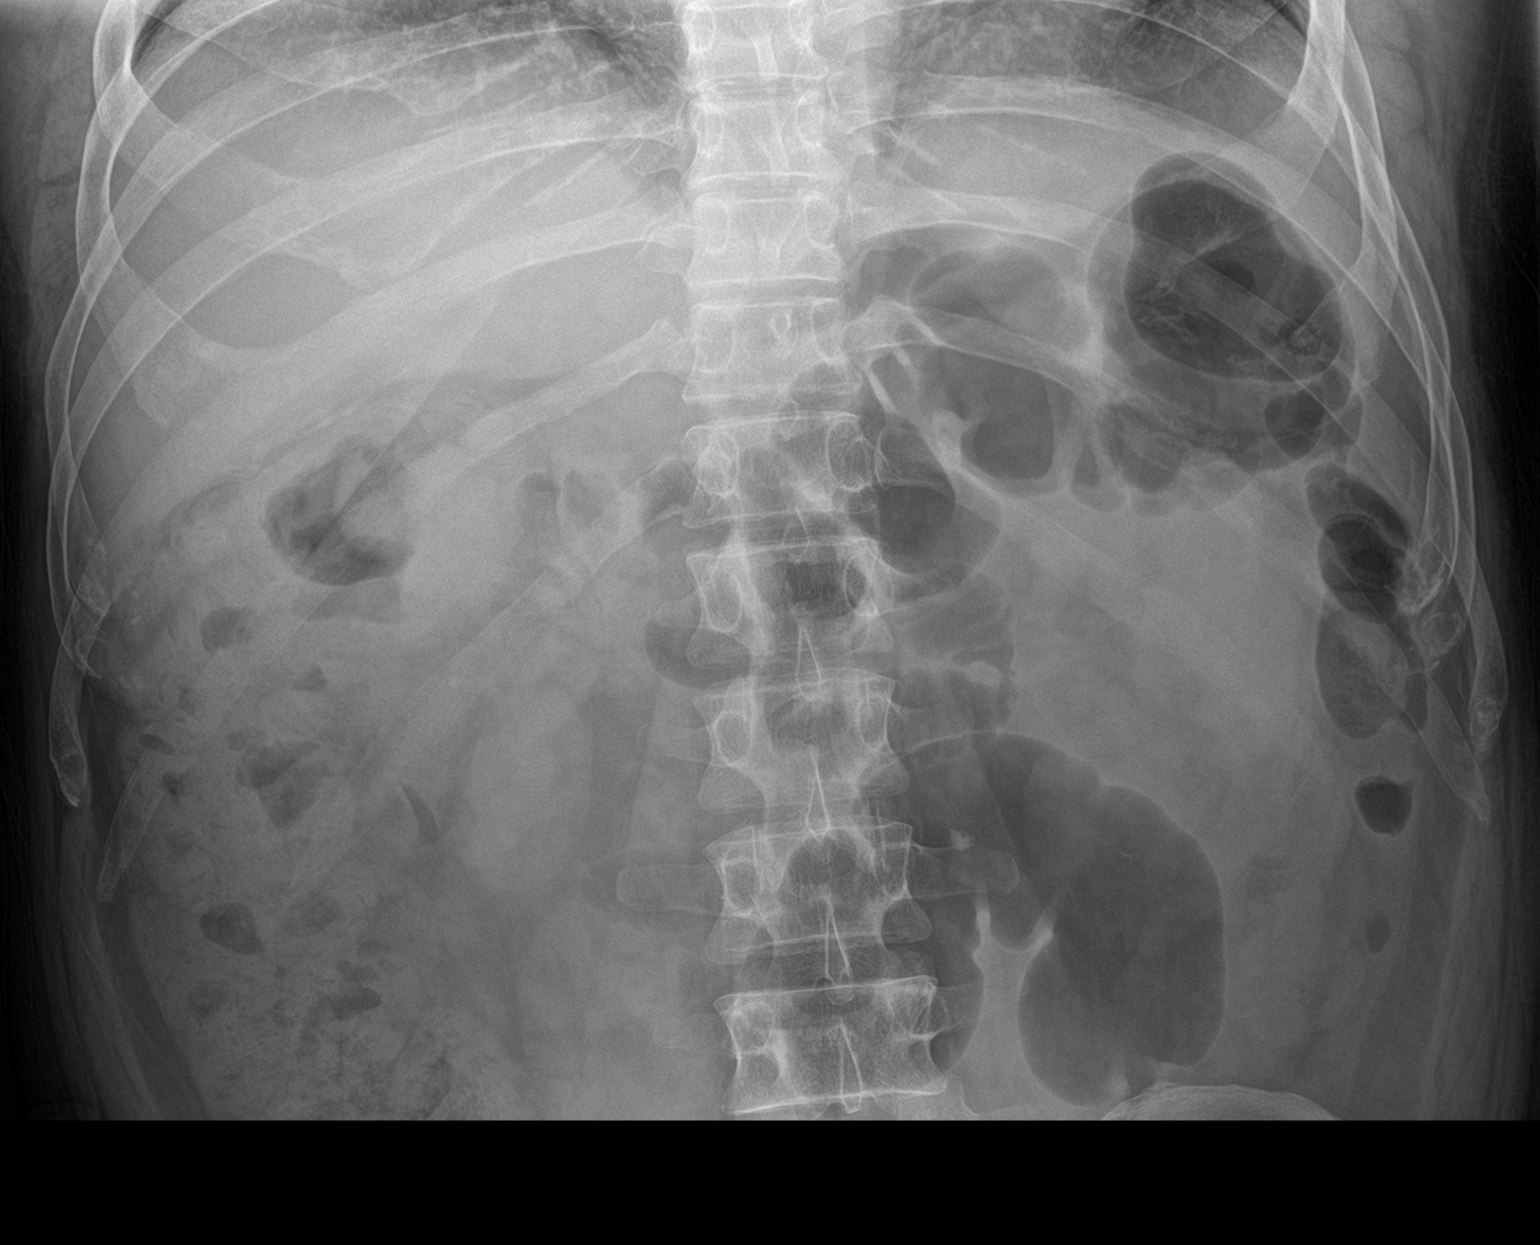

[2 of 2 positions shown; findings below may reference images not displayed]

FINDINGS: The patient has known left ureteral stone measuring up to 6.5 mm is
projected just superior to the left L3 transverse process, not
significantly changed in location in the interval. Probable tiny
stone in the left kidney. This is tiny punctate stones in the mid
right kidney. No other abnormalities.
IMPRESSION: 1. The 6.5 mm stone in the mid left ureter is unchanged in position
since the KUB dated [DATE] [DATE], [DATE].
[DATE]. Other tiny stones in the kidneys as above.

## 2023-06-18 NOTE — ED Provider Notes (Signed)
 ------------------------------------------------------------------------------- Attestation signed by Schuyler [name], MD at 06/21/2023  8:08 AM For this patient encounter, I was the Clinical Oversight Physician. This includes discussion of the plan of care for this patient.   [name] Schuyler, MD -------------------------------------------------------------------------------   ED Provider Note  Subjective   HPI Jonathan Turner is a 43 year old male who presents to the emergency department complaining of foot pain. The patient reports left foot pain since earlier today. He states that he was at work when he stepped out of his truck into a hole. His left foot did a twisting motion and then he fell. No head injury or LOC. No blood thinner usage. He reports numbness and tingling to his 3rd and 4th toes on the left. No medications taken PTA.   Objective   Vitals <redacted file path>: ED Triage Vitals [06/18/23 1541]  BP Pulse Resp Temp SpO2 Flow (L/min) (Oxygen Therapy)  (!) 157/95 72 18 98.4 F (36.9 C) 98 % --   Physical Exam   I have reviewed the vital signs.  I have reviewed the nursing notes.   General:  Awake, alert, no acute distress. Head:  Atraumatic. ENT:  Oral mucosa pink and moist, Neck is supple. Respiratory:  Normal respiratory effort, symmetrical expansion.   Musculoskeletal:  Moving all four extremities through full ROM without pain.  Left lower extremity: No fibular head TTP. No swelling or deformities noted to the left ankle. Full ROM of the L ankle. DP and PT pulses palpable. Patient able to plantarflex and dorsiflex against resistance without difficulty or pain. There is localized swelling to the proximal lateral foot with overlying tenderness. Patient able to wiggle toes. Subjective decrease in sensation in 3rd and 4th toes on the left. Capillary refill <2 seconds.  Neuro:  Alert and oriented.  Interacting appropriately.   Skin:  Visualized skin warm, dry, no  rash.    Assessment and Plan:   Medical Decision Making 43 year old male presenting with foot pain after stepping in a hole while at work. Vitals notable for hypertension, but otherwise stable. Physical examination as noted above. XR obtained, please see ED course. Appears to have avulsion fracture of the cuboid as this is where he is swollen and tender. Placed in a boot and given crutches. Instructed to remain non-weight bearing until follow up with podiatry. Encouraged rest, ice, elevation, and NSAIDs. Patient agreeable to discharge.    Risk in the MDM section refers to billing criteria on potential for complications and/or morbidity/mortality of management as defined by the [facility] and CMS  ED Course <redacted file path>:    ED Course as of 06/18/23 1641  Sat Jun 18, 2023  1559 XR FOOT COMPLETE LT 3+ VW Per my independent interpretation, there appears to be lateral cuboid fracture. Pending official read.  [SR]  1616 XR FOOT COMPLETE LT 3+ VW IMPRESSION: Small ossific density lateral to the cuboid seen on frontal view of the foot suspicious for age indeterminant, possibly acute fracture given there is overlying soft tissue prominence/edema. Correlation with area of pain suggested. Tiny mineralized densities along the dorsal aspect of the navicular which could represent sequelae of prior avulsion injury. Small chronic ossific body projects below lateral malleolus on frontal view of the ankle, possibly from remote injury. No dislocation. Joint spaces are overall within normal limits. Os navicularis. [SR]    ED Course User Index [SR] Estelle Salm E, PA-C   Procedures  Patient Vitals for the past 24 hrs:  BP  Temp Pulse Resp SpO2 Height Weight  06/18/23 1541 (!) 157/95 98.4 F (36.9 C) 72 18 98 % 1.778 m (5' 10) 87.5 kg (193 lb)   Clinical Impression <redacted file path>: 1. Closed nondisplaced fracture of cuboid of left foot, initial encounter Acute    Condition: Stable Disposition  <redacted file path>:Discharge   For this patient encounter, Dr. Charlie GORMAN Jude, MD was the Clinical Oversight Physician. This includes discussion of the plan of care for this patient.

## 2023-06-18 NOTE — ED Triage Notes (Signed)
 Pt comes in after falling in a hole. Pt complains of left foot pain. Pain is a 7/10. Pt is unable to bear weight on the foot. Pt does have a bulge in the side of his foot.

## 2023-06-18 NOTE — ED Notes (Signed)
 Discharge instructions given to and reviewed with patient. Patient verbalized understanding.  Patient was given an opportunity to ask questions or voice concerns.  Patient discharged from the waiting room.

## 2023-12-12 ENCOUNTER — Encounter (HOSPITAL_COMMUNITY): Payer: Self-pay

## 2023-12-12 ENCOUNTER — Emergency Department (HOSPITAL_COMMUNITY)

## 2023-12-12 ENCOUNTER — Other Ambulatory Visit: Payer: Self-pay

## 2023-12-12 ENCOUNTER — Emergency Department (HOSPITAL_COMMUNITY)
Admission: EM | Admit: 2023-12-12 | Discharge: 2023-12-13 | Disposition: A | Discharge status: Home / Self Care | Attending: Emergency Medicine | Admitting: Emergency Medicine

## 2023-12-12 DIAGNOSIS — N132 Hydronephrosis with renal and ureteral calculous obstruction: Secondary | ICD-10-CM | POA: Insufficient documentation

## 2023-12-12 DIAGNOSIS — N200 Calculus of kidney: Secondary | ICD-10-CM

## 2023-12-12 DIAGNOSIS — Z87891 Personal history of nicotine dependence: Secondary | ICD-10-CM | POA: Insufficient documentation

## 2023-12-12 DIAGNOSIS — R109 Unspecified abdominal pain: Secondary | ICD-10-CM | POA: Diagnosis present

## 2023-12-12 DIAGNOSIS — N189 Chronic kidney disease, unspecified: Secondary | ICD-10-CM | POA: Insufficient documentation

## 2023-12-12 LAB — CBC WITH DIFFERENTIAL/PLATELET
Abs Immature Granulocytes: 0.02 K/uL (ref 0.00–0.07)
Basophils Absolute: 0.1 K/uL (ref 0.0–0.1)
Basophils Relative: 1 %
Eosinophils Absolute: 0.1 K/uL (ref 0.0–0.5)
Eosinophils Relative: 2 %
HCT: 46.2 % (ref 39.0–52.0)
Hemoglobin: 15.5 g/dL (ref 13.0–17.0)
Immature Granulocytes: 0 %
Lymphocytes Relative: 40 %
Lymphs Abs: 2.6 K/uL (ref 0.7–4.0)
MCH: 28.9 pg (ref 26.0–34.0)
MCHC: 33.5 g/dL (ref 30.0–36.0)
MCV: 86 fL (ref 80.0–100.0)
Monocytes Absolute: 0.6 K/uL (ref 0.1–1.0)
Monocytes Relative: 10 %
Neutro Abs: 3 K/uL (ref 1.7–7.7)
Neutrophils Relative %: 47 %
Platelets: 384 K/uL (ref 150–400)
RBC: 5.37 MIL/uL (ref 4.22–5.81)
RDW: 12.9 % (ref 11.5–15.5)
WBC: 6.5 K/uL (ref 4.0–10.5)
nRBC: 0 % (ref 0.0–0.2)

## 2023-12-12 LAB — COMPREHENSIVE METABOLIC PANEL WITH GFR
ALT: 64 U/L — ABNORMAL HIGH (ref 0–44)
AST: 34 U/L (ref 15–41)
Albumin: 4.3 g/dL (ref 3.5–5.0)
Alkaline Phosphatase: 51 U/L (ref 38–126)
Anion gap: 12 (ref 5–15)
BUN: 7 mg/dL (ref 6–20)
CO2: 22 mmol/L (ref 22–32)
Calcium: 8.9 mg/dL (ref 8.9–10.3)
Chloride: 102 mmol/L (ref 98–111)
Creatinine, Ser: 0.92 mg/dL (ref 0.61–1.24)
GFR, Estimated: >60 mL/min (ref >60)
Glucose, Bld: 91 mg/dL (ref 70–99)
Potassium: 3.7 mmol/L (ref 3.5–5.1)
Sodium: 136 mmol/L (ref 135–145)
Total Bilirubin: 0.7 mg/dL (ref 0.0–1.2)
Total Protein: 7.5 g/dL (ref 6.5–8.1)

## 2023-12-12 MED ORDER — OXYCODONE-ACETAMINOPHEN 5-325 MG PO TABS
1.0000 | ORAL_TABLET | Freq: Once | ORAL | Status: AC
Start: 2023-12-12 — End: 2023-12-12
  Administered 2023-12-12: 1 via ORAL
  Filled 2023-12-12: qty 1

## 2023-12-12 MED ORDER — ONDANSETRON 4 MG PO TBDP
4.0000 mg | ORAL_TABLET | Freq: Once | ORAL | Status: AC
  Administered 2023-12-12: 4 mg via ORAL
  Filled 2023-12-12: qty 1

## 2023-12-12 NOTE — ED Provider Triage Note (Signed)
 Emergency Medicine Provider Triage Evaluation Note  Jonathan Turner , a 43 y.o. male  was evaluated in triage.  Pt complains of focal right sided flank pain worsening over the last 3 to 4 days, denies dysuria. no blood in urine at this time.  Does endorse multiple episodes of vomiting..  Review of Systems  Positive: Flank pain, nausea, vomiting Negative:   Physical Exam  BP 124/85 (BP Location: Right Arm)   Pulse 72   Temp 98.1 F (36.7 C)   Resp 18   Wt 86.2 kg   SpO2 99%   BMI 27.26 kg/m  Gen:   Awake, no distress   Resp:  Normal effort  MSK:   Moves extremities without difficulty  Other:  Significant right CVA tenderness, suprapubic tenderness, some guarding, no rebound, rigidity  Medical Decision Making  Medically screening exam initiated at 7:04 PM.  Appropriate orders placed.  Jonathan Turner was informed that the remainder of the evaluation will be completed by another provider, this initial triage assessment does not replace that evaluation, and the importance of remaining in the ED until their evaluation is complete.  Workup initiated in triage    Rosan Sherlean VEAR DEVONNA 12/12/23 1905

## 2023-12-12 NOTE — ED Triage Notes (Signed)
 Pt to er, pt states that he has a hx of kidney stones, states that he is here for a kidney stone, states that he has been having pain for the past few days,

## 2023-12-13 LAB — URINALYSIS, ROUTINE W REFLEX MICROSCOPIC
Bacteria, UA: NONE SEEN
Bilirubin Urine: NEGATIVE
Glucose, UA: NEGATIVE mg/dL
Ketones, ur: 5 mg/dL — AB
Leukocytes,Ua: NEGATIVE
Nitrite: NEGATIVE
Protein, ur: NEGATIVE mg/dL
Specific Gravity, Urine: 1.008 (ref 1.005–1.030)
pH: 6 (ref 5.0–8.0)

## 2023-12-13 MED ORDER — OXYCODONE HCL 5 MG PO TABS: 5.0000 mg | ORAL_TABLET | ORAL | 0 refills | Status: AC | PRN

## 2023-12-13 MED ORDER — ONDANSETRON 4 MG PO TBDP: 4.0000 mg | ORAL_TABLET | Freq: Three times a day (TID) | ORAL | 0 refills | Status: AC | PRN

## 2023-12-13 MED ORDER — TAMSULOSIN HCL 0.4 MG PO CAPS
0.4000 mg | ORAL_CAPSULE | Freq: Every day | ORAL | 0 refills | Status: AC
Start: 2023-12-13 — End: ?

## 2023-12-13 MED ORDER — ONDANSETRON 4 MG PO TBDP
4.0000 mg | ORAL_TABLET | Freq: Once | ORAL | Status: AC
  Administered 2023-12-13: 4 mg via ORAL
  Filled 2023-12-13: qty 1

## 2023-12-13 MED ORDER — IBUPROFEN 600 MG PO TABS: 600.0000 mg | ORAL_TABLET | Freq: Four times a day (QID) | ORAL | 0 refills | Status: AC | PRN

## 2023-12-13 MED ORDER — OXYCODONE-ACETAMINOPHEN 5-325 MG PO TABS
1.0000 | ORAL_TABLET | Freq: Once | ORAL | Status: AC
Start: 2023-12-13 — End: 2023-12-13
  Administered 2023-12-13: 1 via ORAL
  Filled 2023-12-13: qty 1

## 2023-12-13 NOTE — ED Provider Notes (Signed)
 [place] EMERGENCY DEPARTMENT AT [facility] Provider Note   CSN: [id number] Arrival date & time: 12/12/23  1816     Patient presents with: Abdominal Pain   Jonathan Turner is a 43 y.o. male.  Patient with past medical history significant for kidney stones, CKD presents the Emergency Department complaining of right sided flank pain which has been intermittent for the past few days, now constant with nausea and vomiting.  He states this feels consistent with previous kidney stones.  He denies fever, shortness of breath.  He denies urinary symptoms and hematuria.    Abdominal Pain      Prior to Admission medications   Medication Sig Start Date End Date Taking? Authorizing Provider  ibuprofen  (ADVIL ) 600 MG tablet Take 1 tablet (600 mg total) by mouth every 6 (six) hours as needed. 12/13/23  Yes Jonathan Ubaldo NOVAK, PA-C  ondansetron  (ZOFRAN -ODT) 4 MG disintegrating tablet Take 1 tablet (4 mg total) by mouth every 8 (eight) hours as needed for nausea or vomiting. 12/13/23  Yes Jonathan Ubaldo NOVAK, PA-C  oxyCODONE  (ROXICODONE ) 5 MG immediate release tablet Take 1 tablet (5 mg total) by mouth every 4 (four) hours as needed for severe pain (pain score 7-10). 12/13/23  Yes Jonathan Ubaldo NOVAK, PA-C  tamsulosin  (FLOMAX ) 0.4 MG CAPS capsule Take 1 capsule (0.4 mg total) by mouth daily. 12/13/23  Yes Jonathan Ubaldo NOVAK, PA-C  albuterol (PROVENTIL HFA;VENTOLIN HFA) 108 (90 Base) MCG/ACT inhaler Inhale 1 puff into the lungs every 6 (six) hours as needed for wheezing or shortness of breath.    [provider]  buPROPion (WELLBUTRIN) 100 MG tablet Take 100 mg by mouth daily.    [provider]  dihydroergotamine  (MIGRANAL ) 4 MG/ML nasal spray Place 1 spray into the nose as needed for migraine (at onset of migraine). No more than 4 sprays in one hour 03/17/18   Georjean Darice HERO, MD  oxyCODONE -acetaminophen  (PERCOCET/ROXICET) 5-325 MG tablet Take by mouth every 4 (four) hours as needed for  severe pain.    [provider]  penicillin  v potassium (VEETID) 500 MG tablet Take 1 tablet (500 mg total) by mouth 3 (three) times daily. 11/24/18   Baxter Drivers, MD  Testosterone POWD by Does not apply route.    [provider]  topiramate  (TOPAMAX ) 25 MG tablet Take 1 tablet every night for 1 week, then increase to 2 tablets every night for 1 week, then increase to 3 tablets every night 02/23/18   Georjean Darice HERO, MD    Allergies: Codeine    Review of Systems  Gastrointestinal:  Positive for abdominal pain.    Updated Vital Signs BP 130/85 (BP Location: Right Arm)   Pulse 81   Temp (!) 97 F (36.1 C) (Oral)   Resp 15   Wt 86.2 kg   SpO2 100%   BMI 27.26 kg/m   Physical Exam Vitals and nursing note reviewed.  Constitutional:      General: He is not in acute distress.    Appearance: He is well-developed.  HENT:     Head: Normocephalic and atraumatic.  Eyes:     Conjunctiva/sclera: Conjunctivae normal.  Cardiovascular:     Rate and Rhythm: Normal rate and regular rhythm.  Pulmonary:     Effort: Pulmonary effort is normal. No respiratory distress.     Breath sounds: Normal breath sounds.  Abdominal:     Palpations: Abdomen is soft.     Tenderness: There is no abdominal tenderness.  There is right CVA tenderness.  Musculoskeletal:        General: No swelling.     Cervical back: Neck supple.  Skin:    General: Skin is warm and dry.     Capillary Refill: Capillary refill takes less than 2 seconds.  Neurological:     Mental Status: He is alert.  Psychiatric:        Mood and Affect: Mood normal.     (all labs ordered are listed, but only abnormal results are displayed) Labs Reviewed  COMPREHENSIVE METABOLIC PANEL WITH GFR - Abnormal; Notable for the following components:      Result Value   ALT 64 (*)    All other components within normal limits  URINALYSIS, ROUTINE W REFLEX MICROSCOPIC - Abnormal; Notable for the following components:   Hgb  urine dipstick MODERATE (*)    Ketones, ur 5 (*)    All other components within normal limits  CBC WITH DIFFERENTIAL/PLATELET    EKG: None  Radiology: CT Renal Stone Study Result Date: 12/12/2023 CLINICAL DATA:  Abdominal and flank pain with stone suspected. Previous history of kidney stones. Pain for a few days. EXAM: CT ABDOMEN AND PELVIS WITHOUT CONTRAST TECHNIQUE: Multidetector CT imaging of the abdomen and pelvis was performed following the standard protocol without IV contrast. RADIATION DOSE REDUCTION: This exam was performed according to the departmental dose-optimization program which includes automated exposure control, adjustment of the mA and/or kV according to patient size and/or use of iterative reconstruction technique. COMPARISON:  Abdominal radiographs 07/02/2021. CT abdomen and pelvis 06/04/2021 FINDINGS: Lower chest: Lung bases are clear. Hepatobiliary: Mild diffuse fatty infiltration of the liver. No focal lesions. Gallbladder and bile ducts are normal. Pancreas: Unremarkable. No pancreatic ductal dilatation or surrounding inflammatory changes. Spleen: Normal in size without focal abnormality. Adrenals/Urinary Tract: No adrenal gland nodules. Bilateral intrarenal stones measuring up to 3 mm diameter. 4 mm stone in the proximal right ureter just past the ureteropelvic junction. Mild proximal hydronephrosis. Distal ureter is decompressed. No bladder stones identified. Stomach/Bowel: Stomach, small bowel, and colon are not abnormally distended. No wall thickening or inflammatory changes. Appendix is not visualized. Vascular/Lymphatic: No significant vascular findings are present. No enlarged abdominal or pelvic lymph nodes. Reproductive: Prostate is unremarkable. Other: No abdominal wall hernia or abnormality. No abdominopelvic ascites. Musculoskeletal: No acute or significant osseous findings. IMPRESSION: 1. 4 mm stone in the proximal right ureter with moderate proximal obstruction. 2.  Additional nonobstructing intrarenal stones bilaterally. 3. Mild diffuse fatty infiltration of the liver. Electronically Signed   By: Elsie Gravely M.D.   On: 12/12/2023 22:32     Procedures   Medications Ordered in the ED  oxyCODONE -acetaminophen  (PERCOCET/ROXICET) 5-325 MG per tablet 1 tablet (1 tablet Oral Given 12/12/23 1908)  ondansetron  (ZOFRAN -ODT) disintegrating tablet 4 mg (4 mg Oral Given 12/12/23 1907)  oxyCODONE -acetaminophen  (PERCOCET/ROXICET) 5-325 MG per tablet 1 tablet (1 tablet Oral Given 12/13/23 0227)  ondansetron  (ZOFRAN -ODT) disintegrating tablet 4 mg (4 mg Oral Given 12/13/23 0227)                                    Medical Decision Making  This patient presents to the ED for concern of right-sided flank pain, this involves an extensive number of treatment options, and is a complaint that carries with it a high risk of complications and morbidity.  The differential diagnosis includes nephrolithiasis, pyelonephritis, hydronephrosis, musculoskeletal pain, others  Co morbidities / Chronic conditions that complicate the patient evaluation  History of kidney stones   Additional history obtained:  Additional history obtained from EMR External records from outside source obtained and reviewed including previous urology note showing patient needing lithotripsy   Lab Tests:  I Ordered, and personally interpreted labs.  The pertinent results include: Unremarkable creatinine, moderate hemoglobin on UA   Imaging Studies ordered:  I ordered imaging studies including CT renal stone study I independently visualized and interpreted imaging which showed  1. 4 mm stone in the proximal right ureter with moderate proximal  obstruction.  2. Additional nonobstructing intrarenal stones bilaterally.  3. Mild diffuse fatty infiltration of the liver.   I agree with the radiologist interpretation   Problem List / ED Course / Critical interventions / Medication  management   I ordered medication including Percocet and Zofran  Reevaluation of the patient after these medicines showed that the patient improved I have reviewed the patients home medicines and have made adjustments as needed   Social Determinants of Health:  Patient is a former smoker   Test / Admission - Considered:  Patient with no signs of infection.  CT scan showing a 4 mm right ureter stone with moderate proximal obstruction.  Patient's pain is well-controlled and he is tolerating oral intake without difficulty.  Plan to discharge home with recommendations for urologic follow-up.  I will prescribe Roxicodone , ibuprofen , Zofran , and Flomax .  Patient has been provided with return precautions.  Patient does not require admission at this time      Final diagnoses:  Kidney stone    ED Discharge Orders          Ordered    ibuprofen  (ADVIL ) 600 MG tablet  Every 6 hours PRN        12/13/23 0215    oxyCODONE  (ROXICODONE ) 5 MG immediate release tablet  Every 4 hours PRN        12/13/23 0215    ondansetron  (ZOFRAN -ODT) 4 MG disintegrating tablet  Every 8 hours PRN        12/13/23 0215    tamsulosin  (FLOMAX ) 0.4 MG CAPS capsule  Daily        12/13/23 0215               Jonathan Ubaldo NOVAK, PA-C 12/13/23 0230    Trine Raynell Moder, MD 12/13/23 [phone]

## 2023-12-13 NOTE — Discharge Instructions (Signed)
You were diagnosed today with a kidney stone.  I have prescribed ibuprofen to be taken every 6 hours, or oxycodone for breakthrough pain, Zofran to be taken as needed for nausea, and Flomax to be taken daily.  I have also provided contact information for urology for follow-up.  Return to the ED immediately if you develop fever, uncontrolled pain or vomiting, or other concerns.

## 2023-12-15 ENCOUNTER — Other Ambulatory Visit: Payer: Self-pay | Admitting: Urology

## 2023-12-15 ENCOUNTER — Encounter (HOSPITAL_COMMUNITY): Payer: Self-pay | Admitting: Urology

## 2023-12-15 NOTE — Progress Notes (Signed)
 Spoke w/ via phone for pre-op interview---Pt Lab needs dos----   KUB      Lab results------ COVID test ---Not indicated--patient states asymptomatic no test needed Arrive at ------0930- NPO after MN   Med rec completed Medications to take morning of surgery -----If needed, Tylenol , oxy, zofran ,flomax ,  Diabetic medication -----  GLP1 agonist last dose: GLP1 instructions:  Patient instructed no nail polish to be worn day of surgery Patient instructed to bring photo id and insurance card day of surgery Patient aware to have Driver (ride ) / caregiver    for 24 hours after surgery - either Girlfriend Vernell [phone] or Myrick Barters [phone] Patient Special Instructions -----Take laxative of choice on Thursday, hydrate well and eat light meal that evening. Do not take any Aspirin for 72 hrs prior to procedure and no NSAIDS, vitamins, supplements for 48 hrs prior to procedure Pre-Op special Instructions -----per Norita Dustman and Litho  Patient verbalized understanding of instructions that were given at this phone interview. Patient denies chest pain, sob, fever, cough at the interview.

## 2023-12-23 ENCOUNTER — Ambulatory Visit (HOSPITAL_COMMUNITY): Admission: RE | Admit: 2023-12-23 | Source: Home / Self Care | Admitting: Urology

## 2023-12-23 ENCOUNTER — Encounter (HOSPITAL_COMMUNITY): Admission: RE | Payer: Self-pay | Source: Home / Self Care

## 2023-12-23 SURGERY — LITHOTRIPSY, ESWL
Anesthesia: LOCAL | Laterality: Right

## 2024-01-14 ENCOUNTER — Emergency Department (HOSPITAL_COMMUNITY)

## 2024-01-14 ENCOUNTER — Emergency Department (HOSPITAL_COMMUNITY)
Admission: EM | Admit: 2024-01-14 | Discharge: 2024-01-14 | Disposition: A | Discharge status: Home / Self Care | Attending: Emergency Medicine | Admitting: Emergency Medicine

## 2024-01-14 ENCOUNTER — Encounter (HOSPITAL_COMMUNITY): Payer: Self-pay

## 2024-01-14 ENCOUNTER — Other Ambulatory Visit: Payer: Self-pay

## 2024-01-14 DIAGNOSIS — R0789 Other chest pain: Secondary | ICD-10-CM | POA: Diagnosis not present

## 2024-01-14 DIAGNOSIS — Z91040 Latex allergy status: Secondary | ICD-10-CM | POA: Insufficient documentation

## 2024-01-14 DIAGNOSIS — N189 Chronic kidney disease, unspecified: Secondary | ICD-10-CM | POA: Diagnosis not present

## 2024-01-14 DIAGNOSIS — R55 Syncope and collapse: Secondary | ICD-10-CM | POA: Insufficient documentation

## 2024-01-14 LAB — COMPREHENSIVE METABOLIC PANEL WITH GFR
ALT: 92 U/L — ABNORMAL HIGH (ref 0–44)
AST: 43 U/L — ABNORMAL HIGH (ref 15–41)
Albumin: 3 g/dL — ABNORMAL LOW (ref 3.5–5.0)
Alkaline Phosphatase: 44 U/L (ref 38–126)
Anion gap: 5 (ref 5–15)
BUN: 8 mg/dL (ref 6–20)
CO2: 21 mmol/L — ABNORMAL LOW (ref 22–32)
Calcium: 6.8 mg/dL — ABNORMAL LOW (ref 8.9–10.3)
Chloride: 115 mmol/L — ABNORMAL HIGH (ref 98–111)
Creatinine, Ser: 0.72 mg/dL (ref 0.61–1.24)
GFR, Estimated: >60 mL/min (ref >60)
Glucose, Bld: 72 mg/dL (ref 70–99)
Potassium: 3.1 mmol/L — ABNORMAL LOW (ref 3.5–5.1)
Sodium: 141 mmol/L (ref 135–145)
Total Bilirubin: 0.4 mg/dL (ref 0.0–1.2)
Total Protein: 5.6 g/dL — ABNORMAL LOW (ref 6.5–8.1)

## 2024-01-14 LAB — URINALYSIS, ROUTINE W REFLEX MICROSCOPIC
Bilirubin Urine: NEGATIVE
Glucose, UA: NEGATIVE mg/dL
Hgb urine dipstick: NEGATIVE
Ketones, ur: NEGATIVE mg/dL
Leukocytes,Ua: NEGATIVE
Nitrite: NEGATIVE
Protein, ur: NEGATIVE mg/dL
Specific Gravity, Urine: 1.018 (ref 1.005–1.030)
pH: 5 (ref 5.0–8.0)

## 2024-01-14 LAB — TROPONIN I (HIGH SENSITIVITY)
Troponin I (High Sensitivity): 3 ng/L (ref <18)
Troponin I (High Sensitivity): 3 ng/L (ref <18)

## 2024-01-14 LAB — CBC
HCT: 43 % (ref 39.0–52.0)
Hemoglobin: 13.8 g/dL (ref 13.0–17.0)
MCH: 28.4 pg (ref 26.0–34.0)
MCHC: 32.1 g/dL (ref 30.0–36.0)
MCV: 88.5 fL (ref 80.0–100.0)
Platelets: 320 K/uL (ref 150–400)
RBC: 4.86 MIL/uL (ref 4.22–5.81)
RDW: 14 % (ref 11.5–15.5)
WBC: 8.4 K/uL (ref 4.0–10.5)
nRBC: 0 % (ref 0.0–0.2)

## 2024-01-14 LAB — CBG MONITORING, ED: Glucose-Capillary: 86 mg/dL (ref 70–99)

## 2024-01-14 MED ORDER — POTASSIUM CHLORIDE CRYS ER 20 MEQ PO TBCR
40.0000 meq | EXTENDED_RELEASE_TABLET | Freq: Once | ORAL | Status: AC
Start: 2024-01-14 — End: 2024-01-14
  Administered 2024-01-14: 40 meq via ORAL
  Filled 2024-01-14: qty 2

## 2024-01-14 MED ORDER — LACTATED RINGERS IV BOLUS
2000.0000 mL | Freq: Once | INTRAVENOUS | Status: AC
  Administered 2024-01-14: 2000 mL via INTRAVENOUS

## 2024-01-14 MED ORDER — POTASSIUM CHLORIDE CRYS ER 20 MEQ PO TBCR: 40.0000 meq | EXTENDED_RELEASE_TABLET | Freq: Every day | ORAL | 0 refills | Status: AC

## 2024-01-14 MED ORDER — POTASSIUM CHLORIDE CRYS ER 20 MEQ PO TBCR: 40.0000 meq | EXTENDED_RELEASE_TABLET | Freq: Every day | ORAL | 0 refills | Status: DC

## 2024-01-14 NOTE — ED Provider Notes (Signed)
  EMERGENCY DEPARTMENT AT [facility] Provider Note   CSN: [id number] Arrival date & time: 01/14/24  8361     Patient presents with: Near Syncope   Jonathan Turner is a 43 y.o. male.   43 year old male presents today for concern of near syncopal episode that occurred just prior to arrival.  Endorses some chest discomfort around the same time.  No shortness of breath.  No palpitations.  States that last night he had drank some beer and today he was working outside cleaning his car, and then he cleaned his pool.  Unable to quantify how much water he drank today.  No prior history of CAD.  Reports he does drink normally but does not drink any liquor.  Only drinks beer.  Denies any illicit drug use.  No current chest pain.  The history is provided by the patient. No language interpreter was used.       Prior to Admission medications   Medication Sig Start Date End Date Taking? Authorizing Provider  potassium chloride  SA (KLOR-CON  M) 20 MEQ tablet Take 2 tablets (40 mEq total) by mouth daily. 01/14/24  Yes Rosamaria Donn, PA-C  albuterol (PROVENTIL HFA;VENTOLIN HFA) 108 (90 Base) MCG/ACT inhaler Inhale 1 puff into the lungs every 6 (six) hours as needed for wheezing or shortness of breath.    [provider]  buPROPion (WELLBUTRIN) 100 MG tablet Take 100 mg by mouth daily.    [provider]  dihydroergotamine  (MIGRANAL ) 4 MG/ML nasal spray Place 1 spray into the nose as needed for migraine (at onset of migraine). No more than 4 sprays in one hour 03/17/18   Georjean Darice HERO, MD  ibuprofen  (ADVIL ) 600 MG tablet Take 1 tablet (600 mg total) by mouth every 6 (six) hours as needed. 12/13/23   Logan Ubaldo NOVAK, PA-C  ketorolac  (TORADOL ) 10 MG tablet Take 10 mg by mouth every 8 (eight) hours as needed for moderate pain (pain score 4-6).    [provider]  ondansetron  (ZOFRAN -ODT) 4 MG disintegrating tablet Take 1 tablet (4 mg total) by mouth every 8 (eight)  hours as needed for nausea or vomiting. 12/13/23   Logan Ubaldo NOVAK, PA-C  oxyCODONE  (ROXICODONE ) 5 MG immediate release tablet Take 1 tablet (5 mg total) by mouth every 4 (four) hours as needed for severe pain (pain score 7-10). 12/13/23   Logan Ubaldo NOVAK, PA-C  oxyCODONE -acetaminophen  (PERCOCET/ROXICET) 5-325 MG tablet Take by mouth every 4 (four) hours as needed for severe pain.    [provider]  penicillin  v potassium (VEETID) 500 MG tablet Take 1 tablet (500 mg total) by mouth 3 (three) times daily. 11/24/18   Baxter Drivers, MD  tamsulosin  (FLOMAX ) 0.4 MG CAPS capsule Take 1 capsule (0.4 mg total) by mouth daily. 12/13/23   Logan Ubaldo NOVAK, PA-C  Testosterone POWD by Does not apply route.    [provider]  topiramate  (TOPAMAX ) 25 MG tablet Take 1 tablet every night for 1 week, then increase to 2 tablets every night for 1 week, then increase to 3 tablets every night 02/23/18   Georjean Darice HERO, MD    Allergies: Codeine and Latex    Review of Systems  Constitutional:  Negative for fever.  Respiratory:  Negative for shortness of breath.   Cardiovascular:  Positive for chest pain. Negative for palpitations and leg swelling.  Genitourinary:  Negative for difficulty urinating.  Musculoskeletal:  Negative for arthralgias.  Neurological:  Negative for light-headedness.  All  other systems reviewed and are negative.   Updated Vital Signs BP 121/84   Pulse 93   Temp 97.7 F (36.5 C)   Resp 18   Ht 5' 10 (1.778 m)   Wt 87 kg   SpO2 100%   BMI 27.52 kg/m   Physical Exam Vitals and nursing note reviewed.  Constitutional:      General: He is not in acute distress.    Appearance: Normal appearance. He is not ill-appearing.  HENT:     Head: Normocephalic and atraumatic.     Nose: Nose normal.  Eyes:     Conjunctiva/sclera: Conjunctivae normal.  Cardiovascular:     Rate and Rhythm: Normal rate and regular rhythm.  Pulmonary:     Effort: Pulmonary effort is normal.  No respiratory distress.     Breath sounds: Normal breath sounds. No wheezing.  Abdominal:     General: There is no distension.     Palpations: Abdomen is soft.     Tenderness: There is no abdominal tenderness.  Musculoskeletal:        General: No deformity. Normal range of motion.     Cervical back: Normal range of motion.     Right lower leg: No edema.     Left lower leg: No edema.  Skin:    Findings: No rash.  Neurological:     Mental Status: He is alert.     (all labs ordered are listed, but only abnormal results are displayed) Labs Reviewed  COMPREHENSIVE METABOLIC PANEL WITH GFR - Abnormal; Notable for the following components:      Result Value   Potassium 3.1 (*)    Chloride 115 (*)    CO2 21 (*)    Calcium 6.8 (*)    Total Protein 5.6 (*)    Albumin 3.0 (*)    AST 43 (*)    ALT 92 (*)    All other components within normal limits  CBC  URINALYSIS, ROUTINE W REFLEX MICROSCOPIC  CBG MONITORING, ED  TROPONIN I (HIGH SENSITIVITY)  TROPONIN I (HIGH SENSITIVITY)    EKG: None  Radiology: DG Chest 2 View Result Date: 01/14/2024 CLINICAL DATA:  chest pain EXAM: CHEST - 2 VIEW COMPARISON:  None Available.  Chest x-ray 07/08/2017 FINDINGS: The heart and mediastinal contours are within normal limits. No focal consolidation. No pulmonary edema. No pleural effusion. No pneumothorax. No acute osseous abnormality. IMPRESSION: No active cardiopulmonary disease. Electronically Signed   By: Morgane  Naveau M.D.   On: 01/14/2024 18:59     Procedures   Medications Ordered in the ED  lactated ringers  bolus 2,000 mL (2,000 mLs Intravenous New Bag/Given 01/14/24 1759)  potassium chloride  SA (KLOR-CON  M) CR tablet 40 mEq (40 mEq Oral Given 01/14/24 1749)                                    Medical Decision Making Amount and/or Complexity of Data Reviewed Labs: ordered. Radiology: ordered.  Risk Prescription drug management.   Medical Decision Making / ED Course   This  patient presents to the ED for concern of lightheadedness, near syncope, this involves an extensive number of treatment options, and is a complaint that carries with it a high risk of complications and morbidity.  The differential diagnosis includes orthostasis, dehydration, ACS, arrhythmia  MDM: 43 year old male presents today for concern of near syncopal episode.  Transient chest pain along with it.  Currently without chest pain.  No prior history of CAD.  Troponin negative x 2.  EKG without acute ischemic change.  Chest x-ray without acute cardiopulmonary process.  Low suspicion for ACS.  CBC unremarkable, CMP shows potassium of 3.1, mildly elevated LFTs which is similar to his baseline.  Preserved creatinine. No concerning arrhythmia on the telemetry.  2 L fluid bolus given. Discussed increasing hydration. Would likely dehydration.  No concerning cause of his symptoms.  Appropriate for discharge.  Discharged in stable condition.   Lab Tests: -I ordered, reviewed, and interpreted labs.   The pertinent results include:   Labs Reviewed  COMPREHENSIVE METABOLIC PANEL WITH GFR - Abnormal; Notable for the following components:      Result Value   Potassium 3.1 (*)    Chloride 115 (*)    CO2 21 (*)    Calcium 6.8 (*)    Total Protein 5.6 (*)    Albumin 3.0 (*)    AST 43 (*)    ALT 92 (*)    All other components within normal limits  CBC  URINALYSIS, ROUTINE W REFLEX MICROSCOPIC  CBG MONITORING, ED  TROPONIN I (HIGH SENSITIVITY)  TROPONIN I (HIGH SENSITIVITY)      EKG  EKG Interpretation Date/Time:    Ventricular Rate:    PR Interval:    QRS Duration:    QT Interval:    QTC Calculation:   R Axis:      Text Interpretation:           Imaging Studies ordered: I ordered imaging studies including chest x-ray I independently visualized and interpreted imaging. I agree with the radiologist interpretation   Medicines ordered and prescription drug management: Meds  ordered this encounter  Medications   lactated ringers  bolus 2,000 mL   potassium chloride  SA (KLOR-CON  M) CR tablet 40 mEq   potassium chloride  SA (KLOR-CON  M) 20 MEQ tablet    Sig: Take 2 tablets (40 mEq total) by mouth daily.    Dispense:  4 tablet    Refill:  0    Supervising Provider:   CLEOTILDE, BRIAN [[id number]]    -I have reviewed the patients home medicines and have made adjustments as needed     Reevaluation: After the interventions noted above, I reevaluated the patient and found that they have :improved  Co morbidities that complicate the patient evaluation  Past Medical History:  Diagnosis Date   Anxiety    Chronic kidney disease    kidney stones   Complication of anesthesia    History of kidney stones       Dispostion: Discharged in stable condition.  Return precaution discussed.  Patient voices understanding and is in agreement with plan.    Final diagnoses:  Near syncope    ED Discharge Orders          Ordered    potassium chloride  SA (KLOR-CON  M) 20 MEQ tablet  Daily        01/14/24 2049               Hildegard Loge, PA-C 01/14/24 2059    Yolande Lamar BROCKS, MD 01/20/24 1029

## 2024-01-14 NOTE — Discharge Instructions (Signed)
 Your workup was reassuring.  No concerning cause of the episode you had before coming in.  Likely due to dehydration.  No concerning cause of chest pain.  Recommend you establish a primary care doctor.  Drink plenty of fluids.  Potassium supplement sent into the pharmacy for you.  Follow-up with the primary care doctor as listed above to repeat the potassium level within a week to ensure that this is not dropping.

## 2024-01-14 NOTE — ED Triage Notes (Addendum)
 Patient BIB [facility] from home. Drank beer last night. Had been outside all day. Went inside and collapsed and began shaking and felt weak. Denies pain. No blood thinners. Did not hit his head.  EMS fluid 18G left AC

## 2024-03-02 ENCOUNTER — Emergency Department (HOSPITAL_COMMUNITY)

## 2024-03-02 ENCOUNTER — Encounter (HOSPITAL_COMMUNITY): Payer: Self-pay | Admitting: Emergency Medicine

## 2024-03-02 ENCOUNTER — Emergency Department (HOSPITAL_COMMUNITY)
Admission: EM | Admit: 2024-03-02 | Discharge: 2024-03-02 | Disposition: A | Discharge status: Home / Self Care | Attending: Emergency Medicine | Admitting: Emergency Medicine

## 2024-03-02 ENCOUNTER — Other Ambulatory Visit: Payer: Self-pay

## 2024-03-02 DIAGNOSIS — M549 Dorsalgia, unspecified: Secondary | ICD-10-CM | POA: Insufficient documentation

## 2024-03-02 DIAGNOSIS — S0093XA Contusion of unspecified part of head, initial encounter: Secondary | ICD-10-CM | POA: Insufficient documentation

## 2024-03-02 DIAGNOSIS — Y92410 Unspecified street and highway as the place of occurrence of the external cause: Secondary | ICD-10-CM | POA: Diagnosis not present

## 2024-03-02 DIAGNOSIS — F10920 Alcohol use, unspecified with intoxication, uncomplicated: Secondary | ICD-10-CM

## 2024-03-02 DIAGNOSIS — Z91040 Latex allergy status: Secondary | ICD-10-CM | POA: Diagnosis not present

## 2024-03-02 DIAGNOSIS — N189 Chronic kidney disease, unspecified: Secondary | ICD-10-CM | POA: Diagnosis not present

## 2024-03-02 DIAGNOSIS — F10120 Alcohol abuse with intoxication, uncomplicated: Secondary | ICD-10-CM | POA: Diagnosis not present

## 2024-03-02 DIAGNOSIS — Z87442 Personal history of urinary calculi: Secondary | ICD-10-CM | POA: Insufficient documentation

## 2024-03-02 DIAGNOSIS — S022XXA Fracture of nasal bones, initial encounter for closed fracture: Secondary | ICD-10-CM | POA: Insufficient documentation

## 2024-03-02 DIAGNOSIS — S0992XA Unspecified injury of nose, initial encounter: Secondary | ICD-10-CM | POA: Diagnosis present

## 2024-03-02 DIAGNOSIS — Y908 Blood alcohol level of 240 mg/100 ml or more: Secondary | ICD-10-CM | POA: Diagnosis not present

## 2024-03-02 LAB — URINALYSIS, ROUTINE W REFLEX MICROSCOPIC
Bilirubin Urine: NEGATIVE
Glucose, UA: NEGATIVE mg/dL
Hgb urine dipstick: NEGATIVE
Ketones, ur: NEGATIVE mg/dL
Leukocytes,Ua: NEGATIVE
Nitrite: NEGATIVE
Protein, ur: NEGATIVE mg/dL
Specific Gravity, Urine: 1.013 (ref 1.005–1.030)
pH: 5 (ref 5.0–8.0)

## 2024-03-02 LAB — CBC
HCT: 47.7 % (ref 39.0–52.0)
Hemoglobin: 15.8 g/dL (ref 13.0–17.0)
MCH: 28.5 pg (ref 26.0–34.0)
MCHC: 33.1 g/dL (ref 30.0–36.0)
MCV: 85.9 fL (ref 80.0–100.0)
Platelets: 335 K/uL (ref 150–400)
RBC: 5.55 MIL/uL (ref 4.22–5.81)
RDW: 13.2 % (ref 11.5–15.5)
WBC: 9.5 K/uL (ref 4.0–10.5)
nRBC: 0 % (ref 0.0–0.2)

## 2024-03-02 LAB — SAMPLE TO BLOOD BANK

## 2024-03-02 LAB — I-STAT CHEM 8, ED
BUN: 4 mg/dL — ABNORMAL LOW (ref 6–20)
Calcium, Ion: 1.09 mmol/L — ABNORMAL LOW (ref 1.15–1.40)
Chloride: 105 mmol/L (ref 98–111)
Creatinine, Ser: 1.2 mg/dL (ref 0.61–1.24)
Glucose, Bld: 141 mg/dL — ABNORMAL HIGH (ref 70–99)
HCT: 45 % (ref 39.0–52.0)
Hemoglobin: 15.3 g/dL (ref 13.0–17.0)
Potassium: 3.7 mmol/L (ref 3.5–5.1)
Sodium: 142 mmol/L (ref 135–145)
TCO2: 21 mmol/L — ABNORMAL LOW (ref 22–32)

## 2024-03-02 LAB — COMPREHENSIVE METABOLIC PANEL WITH GFR
ALT: 85 U/L — ABNORMAL HIGH (ref 0–44)
AST: 60 U/L — ABNORMAL HIGH (ref 15–41)
Albumin: 4.7 g/dL (ref 3.5–5.0)
Alkaline Phosphatase: 73 U/L (ref 38–126)
Anion gap: 16 — ABNORMAL HIGH (ref 5–15)
BUN: 5 mg/dL — ABNORMAL LOW (ref 6–20)
CO2: 19 mmol/L — ABNORMAL LOW (ref 22–32)
Calcium: 8.8 mg/dL — ABNORMAL LOW (ref 8.9–10.3)
Chloride: 105 mmol/L (ref 98–111)
Creatinine, Ser: 0.93 mg/dL (ref 0.61–1.24)
GFR, Estimated: >60 mL/min (ref >60)
Glucose, Bld: 150 mg/dL — ABNORMAL HIGH (ref 70–99)
Potassium: 3.6 mmol/L (ref 3.5–5.1)
Sodium: 141 mmol/L (ref 135–145)
Total Bilirubin: 0.6 mg/dL (ref 0.0–1.2)
Total Protein: 7.4 g/dL (ref 6.5–8.1)

## 2024-03-02 LAB — ETHANOL: Alcohol, Ethyl (B): 292 mg/dL — ABNORMAL HIGH (ref <15)

## 2024-03-02 MED ORDER — HYDROMORPHONE HCL 1 MG/ML IJ SOLN
0.5000 mg | Freq: Once | INTRAMUSCULAR | Status: AC
  Administered 2024-03-02: 0.5 mg via INTRAVENOUS
  Filled 2024-03-02: qty 1

## 2024-03-02 MED ORDER — KETOROLAC TROMETHAMINE 15 MG/ML IJ SOLN
15.0000 mg | Freq: Once | INTRAMUSCULAR | Status: AC
  Administered 2024-03-02: 15 mg via INTRAVENOUS
  Filled 2024-03-02: qty 1

## 2024-03-02 MED ORDER — SODIUM CHLORIDE 0.9 % IV SOLN: INTRAVENOUS | Status: DC

## 2024-03-02 MED ORDER — IOHEXOL 300 MG/ML  SOLN
100.0000 mL | Freq: Once | INTRAMUSCULAR | Status: AC | PRN
  Administered 2024-03-02: 100 mL via INTRAVENOUS

## 2024-03-02 MED ORDER — SODIUM CHLORIDE 0.9 % IV BOLUS
1000.0000 mL | Freq: Once | INTRAVENOUS | Status: AC
  Administered 2024-03-02: 1000 mL via INTRAVENOUS
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
## 2024-03-02 NOTE — ED Provider Notes (Signed)
 Patient is awake alert and stable for discharge.  Nasal bone fracture but otherwise CTs are negative.   Doretha Folks, MD 03/02/24 [phone]
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
## 2024-04-23 ENCOUNTER — Emergency Department (HOSPITAL_COMMUNITY): Payer: Self-pay

## 2024-04-23 ENCOUNTER — Emergency Department (HOSPITAL_COMMUNITY)
Admission: EM | Admit: 2024-04-23 | Discharge: 2024-04-24 | Disposition: A | Discharge status: Home / Self Care | Payer: Self-pay | Attending: Emergency Medicine | Admitting: Emergency Medicine

## 2024-04-23 ENCOUNTER — Encounter (HOSPITAL_COMMUNITY): Payer: Self-pay | Admitting: Emergency Medicine

## 2024-04-23 DIAGNOSIS — R42 Dizziness and giddiness: Secondary | ICD-10-CM | POA: Insufficient documentation

## 2024-04-23 DIAGNOSIS — F0781 Postconcussional syndrome: Secondary | ICD-10-CM | POA: Insufficient documentation

## 2024-04-23 DIAGNOSIS — F419 Anxiety disorder, unspecified: Secondary | ICD-10-CM | POA: Insufficient documentation

## 2024-04-23 LAB — I-STAT CHEM 8, ED
BUN: 13 mg/dL (ref 6–20)
Calcium, Ion: 1.13 mmol/L — ABNORMAL LOW (ref 1.15–1.40)
Chloride: 105 mmol/L (ref 98–111)
Creatinine, Ser: 0.9 mg/dL (ref 0.61–1.24)
Glucose, Bld: 92 mg/dL (ref 70–99)
HCT: 48 % (ref 39.0–52.0)
Hemoglobin: 16.3 g/dL (ref 13.0–17.0)
Potassium: 3.8 mmol/L (ref 3.5–5.1)
Sodium: 139 mmol/L (ref 135–145)
TCO2: 24 mmol/L (ref 22–32)

## 2024-04-23 LAB — CBC WITH DIFFERENTIAL/PLATELET
Abs Immature Granulocytes: 0.02 K/uL (ref 0.00–0.07)
Basophils Absolute: 0.1 K/uL (ref 0.0–0.1)
Basophils Relative: 1 %
Eosinophils Absolute: 0.1 K/uL (ref 0.0–0.5)
Eosinophils Relative: 2 %
HCT: 45.6 % (ref 39.0–52.0)
Hemoglobin: 15.8 g/dL (ref 13.0–17.0)
Immature Granulocytes: 0 %
Lymphocytes Relative: 24 %
Lymphs Abs: 1.8 K/uL (ref 0.7–4.0)
MCH: 28.9 pg (ref 26.0–34.0)
MCHC: 34.6 g/dL (ref 30.0–36.0)
MCV: 83.5 fL (ref 80.0–100.0)
Monocytes Absolute: 0.6 K/uL (ref 0.1–1.0)
Monocytes Relative: 8 %
Neutro Abs: 4.6 K/uL (ref 1.7–7.7)
Neutrophils Relative %: 65 %
Platelets: 327 K/uL (ref 150–400)
RBC: 5.46 MIL/uL (ref 4.22–5.81)
RDW: 12.7 % (ref 11.5–15.5)
WBC: 7.2 K/uL (ref 4.0–10.5)
nRBC: 0 % (ref 0.0–0.2)

## 2024-04-23 LAB — COMPREHENSIVE METABOLIC PANEL WITH GFR
ALT: 75 U/L — ABNORMAL HIGH (ref 0–44)
AST: 38 U/L (ref 15–41)
Albumin: 4.6 g/dL (ref 3.5–5.0)
Alkaline Phosphatase: 65 U/L (ref 38–126)
Anion gap: 10 (ref 5–15)
BUN: 11 mg/dL (ref 6–20)
CO2: 22 mmol/L (ref 22–32)
Calcium: 9.4 mg/dL (ref 8.9–10.3)
Chloride: 104 mmol/L (ref 98–111)
Creatinine, Ser: 0.83 mg/dL (ref 0.61–1.24)
GFR, Estimated: >60 mL/min (ref >60)
Glucose, Bld: 99 mg/dL (ref 70–99)
Potassium: 3.6 mmol/L (ref 3.5–5.1)
Sodium: 136 mmol/L (ref 135–145)
Total Bilirubin: 1 mg/dL (ref 0.0–1.2)
Total Protein: 7.7 g/dL (ref 6.5–8.1)

## 2024-04-23 LAB — TROPONIN I (HIGH SENSITIVITY)
Troponin I (High Sensitivity): 2 ng/L (ref <18)
Troponin I (High Sensitivity): 3 ng/L (ref <18)
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
## 2024-04-24 ENCOUNTER — Emergency Department (HOSPITAL_COMMUNITY): Payer: Self-pay

## 2024-04-24 MED ORDER — MECLIZINE HCL 25 MG PO TABS: 25.0000 mg | ORAL_TABLET | Freq: Three times a day (TID) | ORAL | 0 refills | Status: AC | PRN

## 2024-04-24 MED ORDER — MECLIZINE HCL 25 MG PO TABS
25.0000 mg | ORAL_TABLET | Freq: Once | ORAL | Status: AC
  Administered 2024-04-24: 25 mg via ORAL
  Filled 2024-04-24: qty 1

## 2024-04-24 MED ORDER — MECLIZINE HCL 25 MG PO TABS: 25.0000 mg | ORAL_TABLET | Freq: Three times a day (TID) | ORAL | 0 refills | Status: DC | PRN
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
